# Patient Record
Sex: Male | Born: 1990 | Race: White | Hispanic: No | Marital: Married | Smoking: Never smoker
Health system: Southern US, Community
[De-identification: ages and names within clinical notes are randomized; demographics above are authoritative.]

## PROBLEM LIST (undated history)

## (undated) DIAGNOSIS — F411 Generalized anxiety disorder: Secondary | ICD-10-CM

## (undated) DIAGNOSIS — F322 Major depressive disorder, single episode, severe without psychotic features: Secondary | ICD-10-CM

## (undated) HISTORY — DX: Generalized anxiety disorder: F41.1

## (undated) HISTORY — DX: Major depressive disorder, single episode, severe without psychotic features: F32.2

---

## 2015-03-29 ENCOUNTER — Encounter: Payer: Self-pay | Admitting: Family Medicine

## 2015-03-29 ENCOUNTER — Ambulatory Visit (INDEPENDENT_AMBULATORY_CARE_PROVIDER_SITE_OTHER): Payer: BC Managed Care – PPO | Admitting: Family Medicine

## 2015-03-29 VITALS — BP 137/85 | HR 80 | Ht 72.0 in | Wt 257.0 lb

## 2015-03-29 DIAGNOSIS — F411 Generalized anxiety disorder: Secondary | ICD-10-CM

## 2015-03-29 DIAGNOSIS — IMO0001 Reserved for inherently not codable concepts without codable children: Secondary | ICD-10-CM

## 2015-03-29 DIAGNOSIS — R030 Elevated blood-pressure reading, without diagnosis of hypertension: Secondary | ICD-10-CM

## 2015-03-29 HISTORY — DX: Generalized anxiety disorder: F41.1

## 2015-03-29 NOTE — Progress Notes (Signed)
Noah Petersen is a 24 y.o. male who presents to [facility] [place]  today for anxiety. Patient notes situational anxiety. 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 Additionally he recently got married. He notes job and home stress. This seems to be worse recently. He denies any stresses interfering with his ability to function at home or work. He does not want to start medicine but is interested in counseling. He denies any chest pains palpitations or shortness of breath. No SI or HI.   History reviewed. No pertinent past medical history. History reviewed. No pertinent past surgical history. History  Substance Use Topics  . Smoking status: Never Smoker   . Smokeless tobacco: Not on file  . Alcohol Use: 0.0 oz/week    0 Standard drinks or equivalent per week   ROS as above Medications: No current outpatient prescriptions on file.   No current facility-administered medications for this visit.   No Known Allergies   Exam:  BP 137/85 mmHg  Pulse 80  Ht 6' (1.829 m)  Wt 257 lb (116.574 kg)  BMI 34.85 kg/m2 Gen: Well NAD HEENT: EOMI,  MMM Lungs: Normal work of breathing. CTABL Heart: RRR no MRG Abd: NABS, Soft. Nondistended, Nontender Exts: Brisk capillary refill, warm and well perfused.  Psych: Alert and oriented normal thought speech and affect. No SI or HI. GAD 7: Is 14  No results found for this or any previous visit (from the past 24 hour(s)). No results found.   Please see individual assessment and plan sections.

## 2015-03-29 NOTE — Assessment & Plan Note (Signed)
Refer to counseling. [facility]. Return in one month may start medicines at that time.

## 2015-03-29 NOTE — Patient Instructions (Signed)
Thank you for coming in today. Contact the [facility]  [address] [place], [place] [address] Phone: [phone] Fax: [phone] ?Email Address: [email or web address]   I will order a referral as needed.  Return in about 1 month.  Call or go to the emergency room if you get worse, have trouble breathing, have chest pains, or palpitations.   Generalized Anxiety Disorder Generalized anxiety disorder (GAD) is a mental disorder. It interferes with life functions, including relationships, work, and school. GAD is different from normal anxiety, which everyone experiences at some point in their lives in response to specific life events and activities. Normal anxiety actually helps [place] prepare for and get through these life events and activities. Normal anxiety goes away after the event or activity is over.  GAD causes anxiety that is not necessarily related to specific events or activities. It also causes excess anxiety in proportion to specific events or activities. The anxiety associated with GAD is also difficult to control. GAD can vary from mild to severe. People with severe GAD can have intense waves of anxiety with physical symptoms (panic attacks).  SYMPTOMS The anxiety and worry associated with GAD are difficult to control. This anxiety and worry are related to many life events and activities and also occur more days than not for 6 months or longer. People with GAD also have three or more of the following symptoms (one or more in children):  Restlessness.   Fatigue.  Difficulty concentrating.   Irritability.  Muscle tension.  Difficulty sleeping or unsatisfying sleep. DIAGNOSIS GAD is diagnosed through an assessment by your health care provider. Your health care provider will ask you questions aboutyour mood,physical symptoms, and events in your life. Your health care provider may ask you about your medical history and use of  alcohol or drugs, including prescription medicines. Your health care provider may also do a physical exam and blood tests. Certain medical conditions and the use of certain substances can cause symptoms similar to those associated with GAD. Your health care provider may refer you to a mental health specialist for further evaluation. TREATMENT The following therapies are usually used to treat GAD:   Medication. Antidepressant medication usually is prescribed for long-term daily control. Antianxiety medicines may be added in severe cases, especially when panic attacks occur.   Talk therapy (psychotherapy). Certain types of talk therapy can be helpful in treating GAD by providing support, education, and guidance. A form of talk therapy called cognitive behavioral therapy can teach you healthy ways to think about and react to daily life events and activities.  Stress managementtechniques. These include yoga, meditation, and exercise and can be very helpful when they are practiced regularly. A mental health specialist can help determine which treatment is best for you. Some people see improvement with one therapy. However, other people require a combination of therapies. Document Released: 12/15/2012 Document Revised: 01/04/2014 Document Reviewed: 12/15/2012 [facility] Patient Information 2015 [place], [place]. This information is not intended to replace advice given to you by your health care provider. Make sure you discuss any questions you have with your health care provider.

## 2015-03-29 NOTE — Assessment & Plan Note (Signed)
Not yet hypertension continue to monitor

## 2015-04-28 ENCOUNTER — Ambulatory Visit: Payer: BC Managed Care – PPO | Admitting: Family Medicine

## 2015-05-06 ENCOUNTER — Encounter: Payer: Self-pay | Admitting: Family Medicine

## 2015-05-06 ENCOUNTER — Ambulatory Visit (INDEPENDENT_AMBULATORY_CARE_PROVIDER_SITE_OTHER): Payer: BC Managed Care – PPO | Admitting: Family Medicine

## 2015-05-06 VITALS — BP 130/80 | HR 62 | Wt 259.0 lb

## 2015-05-06 DIAGNOSIS — M79671 Pain in right foot: Secondary | ICD-10-CM | POA: Diagnosis not present

## 2015-05-06 DIAGNOSIS — M79672 Pain in left foot: Secondary | ICD-10-CM | POA: Diagnosis not present

## 2015-05-06 NOTE — Progress Notes (Signed)
Noah Petersen is a 24 y.o. male who presents to [name]  today for orthotics and to discuss right-sided eye pain. A few days ago patient had about a minute of right eye pain with some blurry vision. This occurred spontaneously. He has not had any since. He feels well.  Additionally patient is here for orthotics. His existing rigid orthotics but notes that they are worn out and he would like new ones now.   No past medical history on file. No past surgical history on file. Social History  Substance Use Topics  . Smoking status: Never Smoker   . Smokeless tobacco: Not on file  . Alcohol Use: 0.0 oz/week    0 Standard drinks or equivalent per week   family history is not on file.  ROS as above Medications: No current outpatient prescriptions on file.   No current facility-administered medications for this visit.   No Known Allergies   Exam:  BP 130/80 mmHg  Pulse 62  Wt 259 lb (117.482 kg) Gen: Well NAD HEENT: EOMI,  MMM PERRLA   Visual acuity was 20/20 or better bilaterally     Patient was fitted for a : standard, cushioned, semi-rigid orthotic. The orthotic was heated and afterward the patient stood on the orthotic blank positioned on the orthotic stand. The patient was positioned in subtalar neutral position and 10 degrees of ankle dorsiflexion in a weight bearing stance. After completion of molding, a stable base was applied to the orthotic blank. The blank was ground to a stable position for weight bearing. Size: 12 Base: [place] [facility], hospital and Padding: None The patient ambulated these, and they were very comfortable.  I spent 40 minutes with this patient, greater than 50% was face-to-face time counseling regarding the below diagnosis.    No results found for this or any previous visit (from the past 24 hour(s)). No results found.   Eye pain unclear etiology. Patient is currently asymptomatic now with normal  eye exam and visual acuity. Return as needed

## 2015-05-06 NOTE — Assessment & Plan Note (Signed)
Patient was fitted for orthotics 05/06/2015

## 2015-05-06 NOTE — Patient Instructions (Signed)
Thank you for coming in today. Good luck.  Let me know if those orthotics bother you.  Return as needed.  Let me know if your eyes bother your.

## 2016-11-23 ENCOUNTER — Encounter: Payer: Self-pay | Admitting: Family Medicine

## 2016-11-23 ENCOUNTER — Ambulatory Visit (INDEPENDENT_AMBULATORY_CARE_PROVIDER_SITE_OTHER): Payer: BLUE CROSS/BLUE SHIELD | Admitting: Family Medicine

## 2016-11-23 ENCOUNTER — Ambulatory Visit: Payer: BLUE CROSS/BLUE SHIELD | Admitting: Sports Medicine

## 2016-11-23 VITALS — BP 128/72 | HR 78 | Wt 266.0 lb

## 2016-11-23 DIAGNOSIS — F411 Generalized anxiety disorder: Secondary | ICD-10-CM | POA: Diagnosis not present

## 2016-11-23 DIAGNOSIS — F325 Major depressive disorder, single episode, in full remission: Secondary | ICD-10-CM | POA: Insufficient documentation

## 2016-11-23 DIAGNOSIS — F322 Major depressive disorder, single episode, severe without psychotic features: Secondary | ICD-10-CM | POA: Diagnosis not present

## 2016-11-23 HISTORY — DX: Major depressive disorder, single episode, severe without psychotic features: F32.2

## 2016-11-23 MED ORDER — SERTRALINE HCL 25 MG PO TABS: ORAL_TABLET | ORAL | 0 refills | Status: DC

## 2016-11-23 NOTE — Progress Notes (Signed)
Noah Petersen is a 26 y.o. male who [facility] today for anxiety and depression. Patient notes a several month history of worsening anxiety and depression symptoms. 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 He quit his job because he hated it and now is working for [employer] and feels much better. He does note however his anxiety continues to be quite bothersome. He notes difficulty with sleep and excessive worrying. Additionally he notes depression symptoms but denies any SI or HI. He's never been on medications for this but he does note a family history of anxiety and depression.   Past Medical History:  Diagnosis Date  . 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 Generalized anxiety disorder 03/29/2015   No past surgical history on file. Social History  Substance Use Topics  . Smoking status: Never Smoker  . Smokeless tobacco: Never Used  . Alcohol use 0.0 oz/week   family history is not on file.  ROS as above:  Medications: Current Outpatient Prescriptions  Medication Sig Dispense Refill  . sertraline (ZOLOFT) 25 MG tablet Take 1 pill po daily then increase to 2 pills daily. 30 tablet 0   No current facility-administered medications for this visit.    No Known Allergies  Health Maintenance Health Maintenance  Topic Date Due  . HIV Screening  11/25/2004  . TETANUS/TDAP  11/25/2008  . INFLUENZA VACCINE  04/03/2016     Exam:  BP 128/72   Pulse 78   Wt 266 lb (120.7 kg)   BMI 36.08 kg/m  Gen: Well NAD Psych alert and oriented normal speech thought process and affect no SI or HI expressed.  [facility] 11/23/2016  Decreased Interest 3  Down, Depressed, Hopeless 3  PHQ - 2 Score 6  Altered sleeping 2  Tired, decreased energy 3  Change in appetite 3   Feeling bad or failure about yourself  3  Trouble concentrating 1  Moving slowly or fidgety/restless 2  Suicidal thoughts 0  PHQ-9 Score 20   GAD 7 : Generalized Anxiety Score 11/23/2016  Nervous, Anxious, on Edge 3  Control/stop worrying 1  Worry too much - different things 2  Trouble relaxing 1  Restless 2  Easily annoyed or irritable 2  Afraid - awful might happen 1  Total GAD 7 Score 12        No results found for this or any previous visit (from the past 72 hour(s)). No results found.    Assessment and Plan: 26 y.o. male with generalized anxiety disorder with major depression. Plan to refer for counseling and start Zoloft. We'll start 25 mg and increase to 50 mg one week and recheck in 2 weeks. Goal for 100 mg a day.   Orders Placed This Encounter  Procedures  . Ambulatory referral to Psychology    Referral Priority:   Routine    Referral Type:   Psychiatric    Referral Reason:   Specialty Services Required    Requested Specialty:   Psychology    Number of Visits Requested:   1   Meds ordered this encounter  Medications  . sertraline (ZOLOFT) 25 MG tablet    Sig: Take 1 pill po daily then increase to 2 pills daily.    Dispense:  30 tablet    Refill:  0     Discussed warning signs or  symptoms. Please see discharge instructions. Patient expresses understanding.

## 2016-11-23 NOTE — Patient Instructions (Signed)
Thank you for coming in today. START Zoloft 1 pill daily.  Increase to 2 pills daily.  Recheck in 2 weeks.   You should also be researching therapy options.   Sertraline tablets What is this medicine? SERTRALINE (SER tra leen) is used to treat depression. It may also be used to treat obsessive compulsive disorder, panic disorder, post-trauma stress, premenstrual dysphoric disorder (PMDD) or social anxiety. This medicine may be used for other purposes; ask your health care provider or pharmacist if you have questions. COMMON BRAND NAME(S): Zoloft What should I tell my health care provider before I take this medicine? They need to know if you have any of these conditions: -bleeding disorders -bipolar disorder or a family history of bipolar disorder -glaucoma -heart disease -high blood pressure -history of irregular heartbeat -history of low levels of calcium, magnesium, or potassium in the blood -if you often drink alcohol -liver disease -receiving electroconvulsive therapy -seizures -suicidal thoughts, plans, or attempt; a previous suicide attempt by you or a family member -take medicines that treat or prevent blood clots -thyroid disease -an unusual or allergic reaction to sertraline, other medicines, foods, dyes, or preservatives -pregnant or trying to get pregnant -breast-feeding How should I use this medicine? Take this medicine by mouth with a glass of water. Follow the directions on the prescription label. You can take it with or without food. Take your medicine at regular intervals. Do not take your medicine more often than directed. Do not stop taking this medicine suddenly except upon the advice of your doctor. Stopping this medicine too quickly may cause serious side effects or your condition may worsen. A special MedGuide will be given to you by the pharmacist with each prescription and refill. Be sure to read this information carefully each time. Talk to your pediatrician  regarding the use of this medicine in children. While this drug may be prescribed for children as young as 7 years for selected conditions, precautions do apply. Overdosage: If you think you have taken too much of this medicine contact a poison control center or emergency room at once. NOTE: This medicine is only for you. Do not share this medicine with others. What if I miss a dose? If you miss a dose, take it as soon as you can. If it is almost time for your next dose, take only that dose. Do not take double or extra doses. What may interact with this medicine? Do not take this medicine with any of the following medications: -cisapride -dofetilide -dronedarone -linezolid -MAOIs like Carbex, Eldepryl, Marplan, Nardil, and Parnate -methylene blue (injected into a vein) -pimozide -thioridazine This medicine may also interact with the following medications: -alcohol -amphetamines -aspirin and aspirin-like medicines -certain medicines for depression, anxiety, or psychotic disturbances -certain medicines for fungal infections like ketoconazole, fluconazole, posaconazole, and itraconazole -certain medicines for irregular heart beat like flecainide, quinidine, propafenone -certain medicines for migraine headaches like almotriptan, eletriptan, frovatriptan, naratriptan, rizatriptan, sumatriptan, zolmitriptan -certain medicines for sleep -certain medicines for seizures like carbamazepine, valproic acid, phenytoin -certain medicines that treat or prevent blood clots like warfarin, enoxaparin, dalteparin -cimetidine -digoxin -diuretics -fentanyl -isoniazid -lithium -NSAIDs, medicines for pain and inflammation, like ibuprofen or naproxen -other medicines that prolong the QT interval (cause an abnormal heart rhythm) -rasagiline -safinamide -supplements like St. John's wort, kava kava, valerian -tolbutamide -tramadol -tryptophan This list may not describe all possible interactions. Give  your health care provider a list of all the medicines, herbs, non-prescription drugs, or dietary supplements you use. Also  tell them if you smoke, drink alcohol, or use illegal drugs. Some items may interact with your medicine. What should I watch for while using this medicine? Tell your doctor if your symptoms do not get better or if they get worse. Visit your doctor or health care professional for regular checks on your progress. Because it may take several weeks to see the full effects of this medicine, it is important to continue your treatment as prescribed by your doctor. Patients and their families should watch out for new or worsening thoughts of suicide or depression. Also watch out for sudden changes in feelings such as feeling anxious, agitated, panicky, irritable, hostile, aggressive, impulsive, severely restless, overly excited and hyperactive, or not being able to sleep. If this happens, especially at the beginning of treatment or after a change in dose, call your health care professional. Bonita Quin may get drowsy or dizzy. Do not drive, use machinery, or do anything that needs mental alertness until you know how this medicine affects you. Do not stand or sit up quickly, especially if you are an older patient. This reduces the risk of dizzy or fainting spells. Alcohol may interfere with the effect of this medicine. Avoid alcoholic drinks. Your mouth may get dry. Chewing sugarless gum or sucking hard candy, and drinking plenty of water may help. Contact your doctor if the problem does not go away or is severe. What side effects may I notice from receiving this medicine? Side effects that you should report to your doctor or health care professional as soon as possible: -allergic reactions like skin rash, itching or hives, swelling of the face, lips, or tongue -anxious -black, tarry stools -changes in vision -confusion -elevated mood, decreased need for sleep, racing thoughts, impulsive  behavior -eye pain -fast, irregular heartbeat -feeling faint or lightheaded, falls -feeling agitated, angry, or irritable -hallucination, loss of contact with reality -loss of balance or coordination -loss of memory -painful or prolonged erections -restlessness, pacing, inability to keep still -seizures -stiff muscles -suicidal thoughts or other mood changes -trouble sleeping -unusual bleeding or bruising -unusually weak or tired -vomiting Side effects that usually do not require medical attention (report to your doctor or health care professional if they continue or are bothersome): -change in appetite or weight -change in sex drive or performance -diarrhea -increased sweating -indigestion, nausea -tremors This list may not describe all possible side effects. Call your doctor for medical advice about side effects. You may report side effects to FDA at 1-800-FDA-1088. Where should I keep my medicine? Keep out of the reach of children. Store at room temperature between 15 and 30 degrees C (59 and 86 degrees F). Throw away any unused medicine after the expiration date. NOTE: This sheet is a summary. It may not cover all possible information. If you have questions about this medicine, talk to your doctor, pharmacist, or health care provider.  2018 Elsevier/Gold Standard (2016-08-24 14:17:49)

## 2016-12-07 ENCOUNTER — Encounter: Payer: Self-pay | Admitting: Family Medicine

## 2016-12-07 ENCOUNTER — Ambulatory Visit (INDEPENDENT_AMBULATORY_CARE_PROVIDER_SITE_OTHER): Payer: BLUE CROSS/BLUE SHIELD | Admitting: Family Medicine

## 2016-12-07 VITALS — BP 145/70 | HR 66 | Wt 261.0 lb

## 2016-12-07 DIAGNOSIS — F411 Generalized anxiety disorder: Secondary | ICD-10-CM

## 2016-12-07 DIAGNOSIS — F322 Major depressive disorder, single episode, severe without psychotic features: Secondary | ICD-10-CM

## 2016-12-07 DIAGNOSIS — F39 Unspecified mood [affective] disorder: Secondary | ICD-10-CM | POA: Diagnosis not present

## 2016-12-07 MED ORDER — BUPROPION HCL ER (XL) 150 MG PO TB24: 150.0000 mg | ORAL_TABLET | ORAL | 0 refills | Status: DC

## 2016-12-07 MED ORDER — SERTRALINE HCL 100 MG PO TABS: 100.0000 mg | ORAL_TABLET | Freq: Every day | ORAL | 1 refills | Status: DC

## 2016-12-07 NOTE — Progress Notes (Signed)
       Noah Petersen is a 26 y.o. male who presents to [facility] today for follow up anxiety and depression symptoms.   Noah Petersen was seen March 23 where he was diagnosed with recurrent generalized anxiety disorder and depression symptoms. He was started on Zoloft. He has titrated up to 50 mg. He has an initial appointment with a therapist today as well.  He notes the Zoloft has improved his symptoms some. He does note however delayed orgasm with this medication which is obnoxious.   Past Medical History:  Diagnosis Date  . 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 Generalized anxiety disorder 03/29/2015   No past surgical history on file. Social History  Substance Use Topics  . Smoking status: Never Smoker  . Smokeless tobacco: Never Used  . Alcohol use 0.0 oz/week   family history is not on file.  ROS as above:  Medications: Current Outpatient Prescriptions  Medication Sig Dispense Refill  . buPROPion (WELLBUTRIN XL) 150 MG 24 hr tablet Take 1 tablet (150 mg total) by mouth every morning. 90 tablet 0  . sertraline (ZOLOFT) 100 MG tablet Take 1 tablet (100 mg total) by mouth daily. 30 tablet 1   No current facility-administered medications for this visit.    No Known Allergies  Health Maintenance Health Maintenance  Topic Date Due  . HIV Screening  11/25/2004  . TETANUS/TDAP  11/25/2008  . INFLUENZA VACCINE  05/04/2017 (Originally 04/03/2017)     Exam:  BP (!) 145/70   Pulse 66   Wt 261 lb (118.4 kg)   BMI 35.40 kg/m  Gen: Well NAD Psych: Alert and oriented normal speech thought process and affect. Depression screen [facility] 2/9 12/07/2016 11/23/2016  Decreased Interest 1 3  Down, Depressed, Hopeless 1 3  PHQ - 2 Score 2 6  Altered sleeping - 2  Tired, decreased energy - 3  Change in appetite - 3  Feeling bad or failure about yourself  -  3  Trouble concentrating - 1  Moving slowly or fidgety/restless - 2  Suicidal thoughts - 0  PHQ-9 Score - 20      No results found for this or any previous visit (from the past 72 hour(s)). No results found.    Assessment and Plan: 26 y.o. male with  Improving anxiety and depression symptoms. Plan to increase Zoloft 200 mg next week. Add Wellbutrin for sexual side effects prevention. Recheck in about a month or so. Continue counseling/therapy.   No orders of the defined types were placed in this encounter.  Meds ordered this encounter  Medications  . sertraline (ZOLOFT) 100 MG tablet    Sig: Take 1 tablet (100 mg total) by mouth daily.    Dispense:  30 tablet    Refill:  1  . buPROPion (WELLBUTRIN XL) 150 MG 24 hr tablet    Sig: Take 1 tablet (150 mg total) by mouth every morning.    Dispense:  90 tablet    Refill:  0     Discussed warning signs or symptoms. Please see discharge instructions. Patient expresses understanding.

## 2016-12-07 NOTE — Patient Instructions (Addendum)
Thank you for coming in today. Continue zoloft  daily for 1 more week then increase to  daily.  I am sending in  pills so you can take 1/2 pill for 1 week more.   This may change based on psychiatry recommendations.   You should follow up with me in 1 month.    Start welbutrin daily for help with sexual side effects.    Bupropion extended-release tablets (Depression/Mood Disorders) What is this medicine? BUPROPION (byoo PROE pee on) is used to treat depression. This medicine may be used for other purposes; ask your health care provider or pharmacist if you have questions. COMMON BRAND NAME(S): Aplenzin, Budeprion XL, Forfivo XL, Wellbutrin XL What should I tell my health care provider before I take this medicine? They need to know if you have any of these conditions: -an eating disorder, such as anorexia or bulimia -bipolar disorder or psychosis -diabetes or high blood sugar, treated with medication -glaucoma -head injury or brain tumor -heart disease, previous heart attack, or irregular heart beat -high blood pressure -kidney or liver disease -seizures (convulsions) -suicidal thoughts or a previous suicide attempt -Tourette's syndrome -weight loss -an unusual or allergic reaction to bupropion, other medicines, foods, dyes, or preservatives -breast-feeding -pregnant or trying to become pregnant How should I use this medicine? Take this medicine by mouth with a glass of water. Follow the directions on the prescription label. You can take it with or without food. If it upsets your stomach, take it with food. Do not crush, chew, or cut these tablets. This medicine is taken once daily at the same time each day. Do not take your medicine more often than directed. Do not stop taking this medicine suddenly except upon the advice of your doctor. Stopping this medicine too quickly may cause serious side effects or your condition may worsen. A special MedGuide will be given to  you by the pharmacist with each prescription and refill. Be sure to read this information carefully each time. Talk to your pediatrician regarding the use of this medicine in children. Special care may be needed. Overdosage: If you think you have taken too much of this medicine contact a poison control center or emergency room at once. NOTE: This medicine is only for you. Do not share this medicine with others. What if I miss a dose? If you miss a dose, skip the missed dose and take your next tablet at the regular time. Do not take double or extra doses. What may interact with this medicine? Do not take this medicine with any of the following medications: -linezolid -MAOIs like Azilect, Carbex, Eldepryl, Marplan, Nardil, and Parnate -methylene blue (injected into a vein) -other medicines that contain bupropion like Zyban This medicine may also interact with the following medications: -alcohol -certain medicines for anxiety or sleep -certain medicines for blood pressure like metoprolol, propranolol -certain medicines for depression or psychotic disturbances -certain medicines for HIV or AIDS like efavirenz, lopinavir, nelfinavir, ritonavir -certain medicines for irregular heart beat like propafenone, flecainide -certain medicines for Parkinson's disease like amantadine, levodopa -certain medicines for seizures like carbamazepine, phenytoin, phenobarbital -cimetidine -clopidogrel -cyclophosphamide -digoxin -furazolidone -isoniazid -nicotine -orphenadrine -procarbazine -steroid medicines like prednisone or cortisone -stimulant medicines for attention disorders, weight loss, or to stay awake -tamoxifen -theophylline -thiotepa -ticlopidine -tramadol -warfarin This list may not describe all possible interactions. Give your health care provider a list of all the medicines, herbs, non-prescription drugs, or dietary supplements you use. Also tell them if you smoke, drink alcohol,  or use  illegal drugs. Some items may interact with your medicine. What should I watch for while using this medicine? Tell your doctor if your symptoms do not get better or if they get worse. Visit your doctor or health care professional for regular checks on your progress. Because it may take several weeks to see the full effects of this medicine, it is important to continue your treatment as prescribed by your doctor. Patients and their families should watch out for new or worsening thoughts of suicide or depression. Also watch out for sudden changes in feelings such as feeling anxious, agitated, panicky, irritable, hostile, aggressive, impulsive, severely restless, overly excited and hyperactive, or not being able to sleep. If this happens, especially at the beginning of treatment or after a change in dose, call your health care professional. Avoid alcoholic drinks while taking this medicine. Drinking large amounts of alcoholic beverages, using sleeping or anxiety medicines, or quickly stopping the use of these agents while taking this medicine may increase your risk for a seizure. Do not drive or use heavy machinery until you know how this medicine affects you. This medicine can impair your ability to perform these tasks. Do not take this medicine close to bedtime. It may prevent you from sleeping. Your mouth may get dry. Chewing sugarless gum or sucking hard candy, and drinking plenty of water may help. Contact your doctor if the problem does not go away or is severe. The tablet shell for some brands of this medicine does not dissolve. This is normal. The tablet shell may appear whole in the stool. This is not a cause for concern. What side effects may I notice from receiving this medicine? Side effects that you should report to your doctor or health care professional as soon as possible: -allergic reactions like skin rash, itching or hives, swelling of the face, lips, or tongue -breathing  problems -changes in vision -confusion -elevated mood, decreased need for sleep, racing thoughts, impulsive behavior -fast or irregular heartbeat -hallucinations, loss of contact with reality -increased blood pressure -redness, blistering, peeling or loosening of the skin, including inside the mouth -seizures -suicidal thoughts or other mood changes -unusually weak or tired -vomiting Side effects that usually do not require medical attention (report to your doctor or health care professional if they continue or are bothersome): -constipation -headache -loss of appetite -nausea -tremors -weight loss This list may not describe all possible side effects. Call your doctor for medical advice about side effects. You may report side effects to FDA at 1-800-FDA-1088. Where should I keep my medicine? Keep out of the reach of children. Store at room temperature between 15 and 30 degrees C (59 and 86 degrees F). Throw away any unused medicine after the expiration date. NOTE: This sheet is a summary. It may not cover all possible information. If you have questions about this medicine, talk to your doctor, pharmacist, or health care provider.  2018 Elsevier/Gold Standard (2016-02-10 13:55:13)

## 2016-12-28 DIAGNOSIS — F39 Unspecified mood [affective] disorder: Secondary | ICD-10-CM | POA: Diagnosis not present

## 2017-01-10 ENCOUNTER — Encounter: Payer: Self-pay | Admitting: Family Medicine

## 2017-01-11 MED ORDER — VILAZODONE HCL 10 & 20 MG PO KIT: 1.0000 | PACK | Freq: Every day | ORAL | 0 refills | Status: DC

## 2017-02-12 ENCOUNTER — Encounter: Payer: Self-pay | Admitting: Family Medicine

## 2017-02-12 ENCOUNTER — Other Ambulatory Visit: Payer: Self-pay | Admitting: Family Medicine

## 2017-02-12 MED ORDER — VILAZODONE HCL 40 MG PO TABS: 40.0000 mg | ORAL_TABLET | Freq: Every day | ORAL | 0 refills | Status: DC

## 2017-02-19 ENCOUNTER — Ambulatory Visit (INDEPENDENT_AMBULATORY_CARE_PROVIDER_SITE_OTHER): Payer: BLUE CROSS/BLUE SHIELD | Admitting: Family Medicine

## 2017-02-19 ENCOUNTER — Encounter: Payer: Self-pay | Admitting: Family Medicine

## 2017-02-19 VITALS — BP 126/70 | HR 72 | Wt 255.0 lb

## 2017-02-19 DIAGNOSIS — E669 Obesity, unspecified: Secondary | ICD-10-CM | POA: Diagnosis not present

## 2017-02-19 DIAGNOSIS — F411 Generalized anxiety disorder: Secondary | ICD-10-CM | POA: Diagnosis not present

## 2017-02-19 DIAGNOSIS — F325 Major depressive disorder, single episode, in full remission: Secondary | ICD-10-CM

## 2017-02-19 NOTE — Progress Notes (Signed)
Noah Petersen is a 26 y.o. male who presents to [name]: Primary Care Sports Medicine today for follow up anxiety and discuss weight loss.   Telford has been taking Viibryd now for about 1 month. He notes that it works quite well and he denies any significant side effects. He's feeling much better. He denies any SI or HI.  Additionally he is working to lose weight. 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 He is eating a calorie strict a diet around 2100 cal per day and exercising moderately.   Past Medical History:  Diagnosis Date  . 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 Generalized anxiety disorder 03/29/2015   No past surgical history on file. Social History  Substance Use Topics  . Smoking status: Never Smoker  . Smokeless tobacco: Never Used  . Alcohol use 0.0 oz/week   family history is not on file.  ROS as above:  Medications: Current Outpatient Prescriptions  Medication Sig Dispense Refill  . buPROPion (WELLBUTRIN XL) 150 MG 24 hr tablet Take 1 tablet (150 mg total) by mouth every morning. 90 tablet 0  . sertraline (ZOLOFT) 100 MG tablet Take 1 tablet (100 mg total) by mouth daily. 30 tablet 1  . Vilazodone HCl (VIIBRYD) 40 MG TABS Take 1 tablet (40 mg total) by mouth daily. 90 tablet 0   No current facility-administered medications for this visit.    No Known Allergies  Health Maintenance Health Maintenance  Topic Date Due  . HIV Screening  11/25/2004  . TETANUS/TDAP  11/25/2008  . INFLUENZA VACCINE  05/04/2017 (Originally 04/03/2017)     Exam:  BP 126/70   Pulse 72   Wt 255 lb (115.7 kg)   BMI 34.58 kg/m   Wt Readings from Last 5 Encounters:  02/19/17 255 lb (115.7 kg)  12/07/16 261 lb (118.4 kg)  11/23/16 266 lb (120.7 kg)  05/06/15 259 lb (117.5 kg)  03/29/15 257 lb (116.6 kg)    Gen: Well  NAD HEENT: EOMI,  MMM Lungs: Normal work of breathing. CTABL Heart: RRR no MRG Abd: NABS, Soft. Nondistended, Nontender Exts: Brisk capillary refill, warm and well perfused.  Psych: Alert and oriented normal speech thought process and affect. No SI or HI expressed. Depression screen [facility] 2/9 02/19/2017 12/07/2016 11/23/2016  Decreased Interest 0 1 3  Down, Depressed, Hopeless 0 1 3  PHQ - 2 Score 0 2 6  Altered sleeping 1 - 2  Tired, decreased energy 0 - 3  Change in appetite 1 - 3  Feeling bad or failure about yourself  0 - 3  Trouble concentrating 0 - 1  Moving slowly or fidgety/restless 0 - 2  Suicidal thoughts 0 - 0  PHQ-9 Score 2 - 20   GAD 7 : Generalized Anxiety Score 02/19/2017 11/23/2016  Nervous, Anxious, on Edge 1 3  Control/stop worrying 1 1  Worry too much - different things 0 2  Trouble relaxing 0 1  Restless 0 2  Easily annoyed or irritable 0 2  Afraid - awful might happen 0 1  Total GAD 7 Score 2 12      No results found for this or any previous visit (from the past 72 hour(s)). No results found.    Assessment and Plan: 26 y.o. male with  Anxiety: Much improved. Plan to continue Viibryd. Encourage exercise for mental health. Recheck yearly as needed.  Obesity: Agree with weight loss.  Calorie restricted diet seems reasonable. Encourage moderate exercise. Recheck as needed.   No orders of the defined types were placed in this encounter.  No orders of the defined types were placed in this encounter.    Discussed warning signs or symptoms. Please see discharge instructions. Patient expresses understanding.

## 2017-02-19 NOTE — Patient Instructions (Signed)
Thank you for coming in today. Continue viibryd.  Recheck in 1 year or sooner if needed.   We can make a second pair or orthotics for your fireboots.  Bring the boots with you.   Work on a calorie restricted diet.  Around 2000 calories per day will get you to around 200 pounds in about 1 year.   Try to get the records of your physical sent to me.

## 2017-03-08 ENCOUNTER — Encounter: Payer: Self-pay | Admitting: Family Medicine

## 2017-03-08 MED ORDER — PREDNISONE 5 MG (48) PO TBPK: ORAL_TABLET | ORAL | 0 refills | Status: DC

## 2017-03-08 MED ORDER — TRIAMCINOLONE ACETONIDE 0.1 % EX CREA: 1.0000 "application " | TOPICAL_CREAM | Freq: Two times a day (BID) | CUTANEOUS | 0 refills | Status: DC

## 2017-04-01 ENCOUNTER — Encounter: Payer: Self-pay | Admitting: Family Medicine

## 2017-04-01 DIAGNOSIS — E669 Obesity, unspecified: Secondary | ICD-10-CM

## 2017-04-26 ENCOUNTER — Ambulatory Visit (INDEPENDENT_AMBULATORY_CARE_PROVIDER_SITE_OTHER): Payer: BLUE CROSS/BLUE SHIELD | Admitting: Family Medicine

## 2017-04-26 ENCOUNTER — Encounter: Payer: Self-pay | Admitting: Family Medicine

## 2017-04-26 VITALS — BP 127/78 | HR 71 | Wt 252.0 lb

## 2017-04-26 DIAGNOSIS — F411 Generalized anxiety disorder: Secondary | ICD-10-CM | POA: Diagnosis not present

## 2017-04-26 DIAGNOSIS — R42 Dizziness and giddiness: Secondary | ICD-10-CM | POA: Insufficient documentation

## 2017-04-26 MED ORDER — VENLAFAXINE HCL ER 75 MG PO CP24: 75.0000 mg | ORAL_CAPSULE | Freq: Every day | ORAL | 1 refills | Status: DC

## 2017-04-26 NOTE — Progress Notes (Signed)
Noah Petersen is a 26 y.o. male who presents to [name]: Primary Care Sports Medicine today for vertigo and anxiety.  Patient notes vertigo over the last few weeks. He's had vertigo in the past but has done well for a long time. He notes a sensation of spinning when he lays down at times. He denies any syncope and feels well otherwise. No injury or change in hearing.  Anxiety: Patient is a long history of anxiety. He has been previously well controlled with Zoloft but had difficulty with delayed orgasm. He was switched to fibroid which worked until recently. He has worsening anxiety symptoms listed below.   Past Medical History:  Diagnosis Date  . 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 Generalized anxiety disorder 03/29/2015   No past surgical history on file. Social History  Substance Use Topics  . Smoking status: Never Smoker  . Smokeless tobacco: Never Used  . Alcohol use 0.0 oz/week   family history is not on file.  ROS as above:  Medications: Current Outpatient Prescriptions  Medication Sig Dispense Refill  . venlafaxine XR (EFFEXOR XR) 75 MG 24 hr capsule Take 1 capsule (75 mg total) by mouth daily with breakfast. 30 capsule 1   No current facility-administered medications for this visit.    No Known Allergies  Health Maintenance Health Maintenance  Topic Date Due  . HIV Screening  11/25/2004  . INFLUENZA VACCINE  04/26/2018 (Originally 04/03/2017)  . TETANUS/TDAP  04/26/2018 (Originally 11/25/2008)     Exam:  BP 127/78   Pulse 71   Wt 252 lb (114.3 kg)   BMI 34.18 kg/m   Wt Readings from Last 10 Encounters:  04/26/17 252 lb (114.3 kg)  02/19/17 255 lb (115.7 kg)  12/07/16 261 lb (118.4 kg)  11/23/16 266 lb (120.7 kg)  05/06/15 259 lb (117.5 kg)  03/29/15 257 lb (116.6 kg)    Gen: Well NAD HEENT: EOMI,  MMM Normal tympanic membranes  bilaterally Lungs: Normal work of breathing. CTABL Heart: RRR no MRG Abd: NABS, Soft. Nondistended, Nontender Exts: Brisk capillary refill, warm and well perfused.  Neuro: Normal coordination balance and gait. Negative Dix-Hallpike test.  Depression screen [facility] 2/9 04/26/2017 02/19/2017 12/07/2016 11/23/2016  Decreased Interest 1 0 1 3  Down, Depressed, Hopeless 1 0 1 3  PHQ - 2 Score 2 0 2 6  Altered sleeping 2 1 - 2  Tired, decreased energy 1 0 - 3  Change in appetite 2 1 - 3  Feeling bad or failure about yourself  0 0 - 3  Trouble concentrating 1 0 - 1  Moving slowly or fidgety/restless 0 0 - 2  Suicidal thoughts 0 0 - 0  PHQ-9 Score 8 2 - 20   GAD 7 : Generalized Anxiety Score 04/26/2017 02/19/2017 11/23/2016  Nervous, Anxious, on Edge 2 1 3   Control/stop worrying 1 1 1   Worry too much - different things 1 0 2  Trouble relaxing 1 0 1  Restless 2 0 2  Easily annoyed or irritable 2 0 2  Afraid - awful might happen 1 0 1  Total GAD 7 Score 10 2 12       No results found for this or any previous visit (from the past 72 hour(s)). No results found.    Assessment and Plan: 26 y.o. male with  Vertigo: Unclear etiology suspect vestibular type. Plan to refer to balance physical therapy for further evaluation and management.  Anxiety not well-controlled with fibroid. Will switch to Effexor and recheck in a month.   Orders Placed This Encounter  Procedures  . Ambulatory referral to Physical Therapy    Referral Priority:   Routine    Referral Type:   Physical Medicine    Referral Reason:   Specialty Services Required    Requested Specialty:   Physical Therapy   Meds ordered this encounter  Medications  . DISCONTD: venlafaxine XR (EFFEXOR XR) 75 MG 24 hr capsule    Sig: Take 1 capsule (75 mg total) by mouth daily with breakfast.    Dispense:  30 capsule    Refill:  1  . venlafaxine XR (EFFEXOR XR) 75 MG 24 hr capsule    Sig: Take 1 capsule (75 mg total) by mouth daily with  breakfast.    Dispense:  30 capsule    Refill:  1     Discussed warning signs or symptoms. Please see discharge instructions. Patient expresses understanding.  I spent 25 minutes with this patient, greater than 50% was face-to-face time counseling regarding cause of vertigo differential diagnosis and treatment plan.Marland Kitchen

## 2017-04-26 NOTE — Patient Instructions (Signed)
Thank you for coming in today. STOP Viibryd.  Take 1/2 pill for 2 days then on the 3rd day (Sunday) start Effexor.  You should also hear from [facility] about dizziness.   Lets recheck in 1 month.   For weight keep track of diet.  Vertigo Vertigo means that you feel like you are moving when you are not. Vertigo can also make you feel like things around you are moving when they are not. This feeling can come and go at any time. Vertigo often goes away on its own. Follow these instructions at home:  Avoid making fast movements.  Avoid driving.  Avoid using heavy machinery.  Avoid doing any task or activity that might cause danger to you or other people if you would have a vertigo attack while you are doing it.  Sit down right away if you feel dizzy or have trouble with your balance.  Take over-the-counter and prescription medicines only as told by your doctor.  Follow instructions from your doctor about which positions or movements you should avoid.  Drink enough fluid to keep your pee (urine) clear or pale yellow.  Keep all follow-up visits as told by your doctor. This is important. Contact a doctor if:  Medicine does not help your vertigo.  You have a fever.  Your problems get worse or you have new symptoms.  Your family or friends see changes in your behavior.  You feel sick to your stomach (nauseous) or you throw up (vomit).  You have a "pins and needles" feeling or you are numb in part of your body. Get help right away if:  You have trouble moving or talking.  You are always dizzy.  You pass out (faint).  You get very bad headaches.  You feel weak or have trouble using your hands, arms, or legs.  You have changes in your hearing.  You have changes in your seeing (vision).  You get a stiff neck.  Bright light starts to bother you. This information is not intended to replace advice given to you by your health care provider. Make sure you discuss any  questions you have with your health care provider. Document Released: 05/29/2008 Document Revised: 01/26/2016 Document Reviewed: 12/13/2014 Elsevier Interactive Patient Education  2018 [employer].  Venlafaxine extended-release capsules What is this medicine? VENLAFAXINE(VEN la fax een) is used to treat depression, anxiety and panic disorder. This medicine may be used for other purposes; ask your health care provider or pharmacist if you have questions. COMMON BRAND NAME(S): Effexor XR What should I tell my health care provider before I take this medicine? They need to know if you have any of these conditions: -bleeding disorders -glaucoma -heart disease -high blood pressure -high cholesterol -kidney disease -liver disease -low levels of sodium in the blood -mania or bipolar disorder -seizures -suicidal thoughts, plans, or attempt; a previous suicide attempt by you or a family -take medicines that treat or prevent blood clots -thyroid disease -an unusual or allergic reaction to venlafaxine, desvenlafaxine, other medicines, foods, dyes, or preservatives -pregnant or trying to get pregnant -breast-feeding How should I use this medicine? Take this medicine by mouth with a full glass of water. Follow the directions on the prescription label. Do not cut, crush, or chew this medicine. Take it with food. If needed, the capsule may be carefully opened and the entire contents sprinkled on a spoonful of cool applesauce. Swallow the applesauce/pellet mixture right away without chewing and follow with a glass of water  to ensure complete swallowing of the pellets. Try to take your medicine at about the same time each day. Do not take your medicine more often than directed. Do not stop taking this medicine suddenly except upon the advice of your doctor. Stopping this medicine too quickly may cause serious side effects or your condition may worsen. A special MedGuide will be given to you by the  pharmacist with each prescription and refill. Be sure to read this information carefully each time. Talk to your pediatrician regarding the use of this medicine in children. Special care may be needed. Overdosage: If you think you have taken too much of this medicine contact a poison control center or emergency room at once. NOTE: This medicine is only for you. Do not share this medicine with others. What if I miss a dose? If you miss a dose, take it as soon as you can. If it is almost time for your next dose, take only that dose. Do not take double or extra doses. What may interact with this medicine? Do not take this medicine with any of the following medications: -certain medicines for fungal infections like fluconazole, itraconazole, ketoconazole, posaconazole, voriconazole -cisapride -desvenlafaxine -dofetilide -dronedarone -duloxetine -levomilnacipran -linezolid -MAOIs like Carbex, Eldepryl, Marplan, Nardil, and Parnate -methylene blue (injected into a vein) -milnacipran -pimozide -thioridazine -ziprasidone This medicine may also interact with the following medications: -amphetamines -aspirin and aspirin-like medicines -certain medicines for depression, anxiety, or psychotic disturbances -certain medicines for migraine headaches like almotriptan, eletriptan, frovatriptan, naratriptan, rizatriptan, sumatriptan, zolmitriptan -certain medicines for sleep -certain medicines that treat or prevent blood clots like dalteparin, enoxaparin, warfarin -cimetidine -clozapine -diuretics -fentanyl -furazolidone -indinavir -isoniazid -lithium -metoprolol -NSAIDS, medicines for pain and inflammation, like ibuprofen or naproxen -other medicines that prolong the QT interval (cause an abnormal heart rhythm) -procarbazine -rasagiline -supplements like St. John's wort, kava kava, valerian -tramadol -tryptophan This list may not describe all possible interactions. Give your health care  provider a list of all the medicines, herbs, non-prescription drugs, or dietary supplements you use. Also tell them if you smoke, drink alcohol, or use illegal drugs. Some items may interact with your medicine. What should I watch for while using this medicine? Tell your doctor if your symptoms do not get better or if they get worse. Visit your doctor or health care professional for regular checks on your progress. Because it may take several weeks to see the full effects of this medicine, it is important to continue your treatment as prescribed by your doctor. Patients and their families should watch out for new or worsening thoughts of suicide or depression. Also watch out for sudden changes in feelings such as feeling anxious, agitated, panicky, irritable, hostile, aggressive, impulsive, severely restless, overly excited and hyperactive, or not being able to sleep. If this happens, especially at the beginning of treatment or after a change in dose, call your health care professional. This medicine can cause an increase in blood pressure. Check with your doctor for instructions on monitoring your blood pressure while taking this medicine. You may get drowsy or dizzy. Do not drive, use machinery, or do anything that needs mental alertness until you know how this medicine affects you. Do not stand or sit up quickly, especially if you are an older patient. This reduces the risk of dizzy or fainting spells. Alcohol may interfere with the effect of this medicine. Avoid alcoholic drinks. Your mouth may get dry. Chewing sugarless gum, sucking hard candy and drinking plenty of water will help.  Contact your doctor if the problem does not go away or is severe. What side effects may I notice from receiving this medicine? Side effects that you should report to your doctor or health care professional as soon as possible: -allergic reactions like skin rash, itching or hives, swelling of the face, lips, or  tongue -anxious -breathing problems -confusion -changes in vision -chest pain -confusion -elevated mood, decreased need for sleep, racing thoughts, impulsive behavior -eye pain -fast, irregular heartbeat -feeling faint or lightheaded, falls -feeling agitated, angry, or irritable -hallucination, loss of contact with reality -high blood pressure -loss of balance or coordination -palpitations -redness, blistering, peeling or loosening of the skin, including inside the mouth -restlessness, pacing, inability to keep still -seizures -stiff muscles -suicidal thoughts or other mood changes -trouble passing urine or change in the amount of urine -trouble sleeping -unusual bleeding or bruising -unusually weak or tired -vomiting Side effects that usually do not require medical attention (report to your doctor or health care professional if they continue or are bothersome): -change in sex drive or performance -change in appetite or weight -constipation -dizziness -dry mouth -headache -increased sweating -nausea -tired This list may not describe all possible side effects. Call your doctor for medical advice about side effects. You may report side effects to FDA at 1-800-FDA-1088. Where should I keep my medicine? Keep out of the reach of children. Store at a controlled temperature between 20 and 25 degrees C (68 degrees and 77 degrees F), in a dry place. Throw away any unused medicine after the expiration date. NOTE: This sheet is a summary. It may not cover all possible information. If you have questions about this medicine, talk to your doctor, pharmacist, or health care provider.  2018 Elsevier/Gold Standard (2016-01-19 18:38:02)

## 2017-05-02 ENCOUNTER — Encounter: Payer: Self-pay | Admitting: Family Medicine

## 2017-05-02 ENCOUNTER — Ambulatory Visit (INDEPENDENT_AMBULATORY_CARE_PROVIDER_SITE_OTHER): Payer: BLUE CROSS/BLUE SHIELD | Admitting: Family Medicine

## 2017-05-02 VITALS — BP 124/66 | HR 73 | Temp 98.3°F | Wt 250.0 lb

## 2017-05-02 DIAGNOSIS — R197 Diarrhea, unspecified: Secondary | ICD-10-CM

## 2017-05-02 NOTE — Progress Notes (Signed)
       Noah Petersen is a 26 y.o. male who [facility] today for diarrhea.   Patient reports diarrhea for 3 days. Patient denies any recent changes in diet. He does not have any cramping, bloating, or abdominal pain. He denies any nausea or vomiting. Patient tried using Pepto-Bismol, but does not feel this helped. Patient denies any fatigue, recent antibiotic use, fevers, or chills. Patient began taking Effexor 5 days ago. Patient does not have any sick contacts.   Patient denies any chest pain, shortness of breath, or changes in urination.   Past Medical History:  Diagnosis Date  . 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 Generalized anxiety disorder 03/29/2015   No past surgical history on file. Social History  Substance Use Topics  . Smoking status: Never Smoker  . Smokeless tobacco: Never Used  . Alcohol use 0.0 oz/week   family history is not on file.  ROS as above:  Medications: Current Outpatient Prescriptions  Medication Sig Dispense Refill  . venlafaxine XR (EFFEXOR XR) 75 MG 24 hr capsule Take 1 capsule (75 mg total) by mouth daily with breakfast. 30 capsule 1   No current facility-administered medications for this visit.    No Known Allergies  Health Maintenance Health Maintenance  Topic Date Due  . HIV Screening  11/25/2004  . INFLUENZA VACCINE  04/26/2018 (Originally 04/03/2017)  . TETANUS/TDAP  04/26/2018 (Originally 11/25/2008)     Exam:  BP 124/66   Pulse 73   Temp 98.3 F (36.8 C) (Oral)   Wt 250 lb (113.4 kg)   BMI 33.91 kg/m  Gen: Well NAD, sitting comfortably on examination table HEENT: EOMI,  MMM Lungs: Normal work of breathing. CTABL Heart: RRR, normal S1 and S2, no MRG Abd: NABS, Soft. Nondistended, Nontender Exts: Brisk capillary refill, warm and well perfused.    No results found for this or  any previous visit (from the past 72 hour(s)). No results found.    Assessment and Plan: 26 y.o. male with diarrhea. The differential diagnosis includes viral gastroenteritis, medication side-effect, and irritable bowel syndrome. Given the temporal relationship between the start of Effexor and beginning of symptoms, the patient may not be tolerating this medication well. Given the absence of other symptoms however, it is also likely that this is viral gastroenteritis. Patient was advised to start prescription strength Imodium and continue to monitor symptoms. If diarrhea persists over the next 3 days, he should discontinue Effexor and we will discuss other treatment options.    No orders of the defined types were placed in this encounter.  No orders of the defined types were placed in this encounter.    Discussed warning signs or symptoms. Please see discharge instructions. Patient expresses understanding.  I spent 25 minutes with this patient, greater than 50% was face-to-face time counseling regarding differential diagnosis treatment options a backup plan.

## 2017-05-02 NOTE — Patient Instructions (Signed)
Thank you for coming in today. Take over the counter Imodium (Loperamide) as on the box for diarrhea.  Continue Effexor.  If symptoms do not improve by Saturday STOP Effexor.  We will likely go to [facility] or the medicine recommend by your therapist.  Make sure to send me that INFO if you can.   Loperamide tablets or capsules What is this medicine? LOPERAMIDE (loe PER a mide) is used to treat diarrhea. This medicine may be used for other purposes; ask your health care provider or pharmacist if you have questions. COMMON BRAND NAME(S): Anti-Diarrheal, Imodium A-D, K-Pek II What should I tell my health care provider before I take this medicine? They need to know if you have any of these conditions: -a black or bloody stool -bacterial food poisoning -colitis or mucus in your stool -currently taking an antibiotic medication for an infection -fever -liver disease -severe abdominal pain, swelling or bulging -an unusual or allergic reaction to loperamide, other medicines, foods, dyes, or preservatives -pregnant or trying to get pregnant -breast-feeding How should I use this medicine? Take this medicine by mouth with a glass of water. Follow the directions on the prescription label. Take your doses at regular intervals. Do not take your medicine more often than directed. Talk to your pediatrician regarding the use of this medicine in children. Special care may be needed. Overdosage: If you think you have taken too much of this medicine contact a poison control center or emergency room at once. NOTE: This medicine is only for you. Do not share this medicine with others. What if I miss a dose? This does not apply. This medicine is not for regular use. Only take this medicine while you continue to have loose bowel movements. Do not take more medicine than recommended by the packaging label or by your healthcare professional. What may interact with this medicine? Do not take this medicine with  any of the following medications: - alosetron This medicine may also interact with the following medications: -cimetidine -clarithromycin -erythromycin -gemfibrozil -itraconazole -ketoconazole -quinidine -quinine -ranitidine -ritonavir -saquinavir This list may not describe all possible interactions. Give your health care provider a list of all the medicines, herbs, non-prescription drugs, or dietary supplements you use. Also tell them if you smoke, drink alcohol, or use illegal drugs. Some items may interact with your medicine. What should I watch for while using this medicine? Do not take this medicine for more than 2 days without asking your doctor or health care professional. Do not use doses higher than those prescribed by your doctor or listed on the label. Check with your doctor or health care professional right away if you develop a fever, severe abdominal pain, swelling or bulging, or if you have have bloody/black diarrhea or stools. You may get drowsy or dizzy. Do not drive, use machinery, or do anything that needs mental alertness until you know how this medicine affects you. Do not stand or sit up quickly, especially if you are an older patient. This reduces the risk of dizzy or fainting spells. Alcohol can increase possible drowsiness and dizziness. Avoid alcoholic drinks. Your mouth may get dry. Chewing sugarless gum or sucking hard candy, and drinking plenty of water may help. Contact your doctor if the problem does not go away or is severe. Drinking plenty of water can also help prevent dehydration that can occur with diarrhea. Elderly patients may have a more variable response to the effects of this medicine, and are more susceptible to the effects of  dehydration. What side effects may I notice from receiving this medicine? Side effects that you should report to your doctor or health care professional as soon as possible: - allergic reactions like skin rash, itching or hives,  swelling of the face, lips, or tongue -bloated, swollen feeling in your abdomen -blurred vision -loss of appetite -signs and symptoms of a dangerous change in heartbeat or heart rhythm like chest pain; dizziness; fast or irregular heartbeat palpitations; feeling faint or lightheaded, falls; breathing problems -stomach pain Side effects that usually do not require medical attention (report to your doctor or health care professional if they continue or are bothersome): - constipation -drowsiness or dizziness -dry mouth -nausea, vomiting This list may not describe all possible side effects. Call your doctor for medical advice about side effects. You may report side effects to FDA at 1-800-FDA-1088. Where should I keep my medicine? Keep out of the reach of children. Store at room temperature between 15 and 25 degrees C (59 and 77 degrees F). Keep container tightly closed. Throw away any unused medicine after the expiration date. NOTE: This sheet is a summary. It may not cover all possible information. If you have questions about this medicine, talk to your doctor, pharmacist, or health care provider.  2018 Elsevier/Gold Standard (2015-02-15 15:29:29)   Vortioxetine oral tablet What is this medicine? Vortioxetine (vor tee [employer] e teen) is used to treat depression. This medicine may be used for other purposes; ask your health care provider or pharmacist if you have questions. COMMON BRAND NAME(S): BRINTELLIX, Trintellix What should I tell my health care provider before I take this medicine? They need to know if you have any of these conditions: -bipolar disorder or a family history of bipolar disorder -bleeding disorders -drink alcohol -glaucoma -liver disease -low levels of sodium in the blood -seizures -suicidal thoughts, plans, or attempt; a previous suicide attempt by you or a family member -take medicines that treat or prevent blood clots -an unusual or allergic reaction to  vortioxetine, other medicines, foods, dyes, or preservatives -pregnant or trying to get pregnant -breast-feeding How should I use this medicine? Take this medicine by mouth with a glass of water. Follow the directions on the prescription label. You can take it with or without food. If it upsets your stomach, take it with food. Take your medicine at regular intervals. Do not take it more often than directed. Do not stop taking this medicine suddenly except upon the advice of your doctor. Stopping this medicine too quickly may cause serious side effects or your condition may worsen. A special MedGuide will be given to you by the pharmacist with each prescription and refill. Be sure to read this information carefully each time. Talk to your pediatrician regarding the use of this medicine in children. Special care may be needed. Overdosage: If you think you have taken too much of this medicine contact a poison control center or emergency room at once. NOTE: This medicine is only for you. Do not share this medicine with others. What if I miss a dose? If you miss a dose, take it as soon as you can. If it is almost time for your next dose, take only that dose. Do not take double or extra doses. What may interact with this medicine? Do not take this medicine with any of the following medications: -linezolid -MAOIs like Carbex, Eldepryl, Marplan, Nardil, and Parnate -methylene blue (injected into a vein) This medicine may also interact with the following medications: -alcohol -aspirin and  aspirin-like medicines -carbamazepine -certain medicines for depression, anxiety, or psychotic disturbances -certain medicines for migraine headache like almotriptan, eletriptan, frovatriptan, naratriptan, rizatriptan, sumatriptan, zolmitriptan -diuretics -fentanyl -furazolidone -isoniazid -medicines that treat or prevent blood clots like warfarin, enoxaparin, and dalteparin -NSAIDs, medicines for pain and  inflammation, like ibuprofen or naproxen -phenytoin -procarbazine -quinidine -rasagiline -rifampin -supplements like St. John's wort, kava kava, valerian -tramadol -tryptophan This list may not describe all possible interactions. Give your health care provider a list of all the medicines, herbs, non-prescription drugs, or dietary supplements you use. Also tell them if you smoke, drink alcohol, or use illegal drugs. Some items may interact with your medicine. What should I watch for while using this medicine? Tell your doctor if your symptoms do not get better or if they get worse. Visit your doctor or health care professional for regular checks on your progress. Because it may take several weeks to see the full effects of this medicine, it is important to continue your treatment as prescribed by your doctor. Patients and their families should watch out for new or worsening thoughts of suicide or depression. Also watch out for sudden changes in feelings such as feeling anxious, agitated, panicky, irritable, hostile, aggressive, impulsive, severely restless, overly excited and hyperactive, or not being able to sleep. If this happens, especially at the beginning of treatment or after a change in dose, call your health care professional. Bonita QuinYou may get drowsy or dizzy. Do not drive, use machinery, or do anything that needs mental alertness until you know how this medicine affects you. Do not stand or sit up quickly, especially if you are an older patient. This reduces the risk of dizzy or fainting spells. Alcohol may interfere with the effect of this medicine. Avoid alcoholic drinks. Your mouth may get dry. Chewing sugarless gum or sucking hard candy, and drinking plenty of water may help. Contact your doctor if the problem does not go away or is severe. What side effects may I notice from receiving this medicine? Side effects that you should report to your doctor or health care professional as soon as  possible: -allergic reactions like skin rash, itching or hives, swelling of the face, lips, or tongue -anxious -black, tarry stools -changes in vision -confusion -elevated mood, decreased need for sleep, racing thoughts, impulsive behavior -eye pain -fast, irregular heartbeat -feeling faint or lightheaded, falls -feeling agitated, angry, or irritable -hallucination, loss of contact with reality -loss of balance or coordination -loss of memory -painful or prolonged erections -restlessness, pacing, inability to keep still -seizures -stiff muscles -suicidal thoughts or other mood changes -trouble sleeping -unusual bleeding or bruising -unusually weak or tired -vomiting Side effects that usually do not require medical attention (report to your doctor or health care professional if they continue or are bothersome): -change in appetite or weight -change in sex drive or performance -constipation -dizziness -dry mouth -nausea This list may not describe all possible side effects. Call your doctor for medical advice about side effects. You may report side effects to FDA at 1-800-FDA-1088. Where should I keep my medicine? Keep out of the reach of children. Store at room temperature between 15 and 30 degrees C (59 and 86 degrees F). Throw away any unused medicine after the expiration date. NOTE: This sheet is a summary. It may not cover all possible information. If you have questions about this medicine, talk to your doctor, pharmacist, or health care provider.  2018 Elsevier/Gold Standard (2016-01-19 16:45:13)    Viral Gastroenteritis, Adult Viral gastroenteritis  is also known as the stomach flu. This condition is caused by various viruses. These viruses can be passed from person to person very easily (are very contagious). This condition may affect your stomach, small intestine, and large intestine. It can cause sudden watery diarrhea, fever, and vomiting. Diarrhea and vomiting can  make you feel weak and cause you to become dehydrated. You may not be able to keep fluids down. Dehydration can make you tired and thirsty, cause you to have a dry mouth, and decrease how often you urinate. Older adults and people with other diseases or a weak immune system are at higher risk for dehydration. It is important to replace the fluids that you lose from diarrhea and vomiting. If you become severely dehydrated, you may need to get fluids through an IV tube. What are the causes? Gastroenteritis is caused by various viruses, including rotavirus and norovirus. Norovirus is the most common cause in adults. You can get sick by eating food, drinking water, or touching a surface contaminated with one of these viruses. You can also get sick from sharing utensils or other personal items with an infected person. What increases the risk? This condition is more likely to develop in people:  Who have a weak defense system (immune system).  Who live with one or more children who are younger than 31 years old.  Who live in a nursing home.  Who go on cruise ships.  What are the signs or symptoms? Symptoms of this condition start suddenly 1-2 days after exposure to a virus. Symptoms may last a few days or as long as a week. The most common symptoms are watery diarrhea and vomiting. Other symptoms include:  Fever.  Headache.  Fatigue.  Pain in the abdomen.  Chills.  Weakness.  Nausea.  Muscle aches.  Loss of appetite.  How is this diagnosed? This condition is diagnosed with a medical history and physical exam. You may also have a stool test to check for viruses or other infections. How is this treated? This condition typically goes away on its own. The focus of treatment is to restore lost fluids (rehydration). Your health care provider may recommend that you take an oral rehydration solution (ORS) to replace important salts and minerals (electrolytes) in your body. Severe cases of  this condition may require giving fluids through an IV tube. Treatment may also include medicine to help with your symptoms. Follow these instructions at home: Follow instructions from your health care provider about how to care for yourself at home. Eating and drinking Follow these recommendations as told by your health care provider:  Take an ORS. This is a drink that is sold at pharmacies and retail stores.  Drink clear fluids in small amounts as you are able. Clear fluids include water, ice chips, diluted fruit juice, and low-calorie sports drinks.  Eat bland, easy-to-digest foods in small amounts as you are able. These foods include bananas, applesauce, rice, lean meats, toast, and crackers.  Avoid fluids that contain a lot of sugar or caffeine, such as energy drinks, sports drinks, and soda.  Avoid alcohol.  Avoid spicy or fatty foods.  General instructions   Drink enough fluid to keep your urine clear or pale yellow.  Wash your hands often. If soap and water are not available, use hand sanitizer.  Make sure that all people in your household wash their hands well and often.  Take over-the-counter and prescription medicines only as told by your health care provider.  Rest  at home while you recover.  Watch your condition for any changes.  Take a warm bath to relieve any burning or pain from frequent diarrhea episodes.  Keep all follow-up visits as told by your health care provider. This is important. Contact a health care provider if:  You cannot keep fluids down.  Your symptoms get worse.  You have new symptoms.  You feel light-headed or dizzy.  You have muscle cramps. Get help right away if:  You have chest pain.  You feel extremely weak or you faint.  You see blood in your vomit.  Your vomit looks like coffee grounds.  You have bloody or black stools or stools that look like tar.  You have a severe headache, a stiff neck, or both.  You have a  rash.  You have severe pain, cramping, or bloating in your abdomen.  You have trouble breathing or you are breathing very quickly.  Your heart is beating very quickly.  Your skin feels cold and clammy.  You feel confused.  You have pain when you urinate.  You have signs of dehydration, such as: ? Dark urine, very little urine, or no urine. ? Cracked lips. ? Dry mouth. ? Sunken eyes. ? Sleepiness. ? Weakness. This information is not intended to replace advice given to you by your health care provider. Make sure you discuss any questions you have with your health care provider. Document Released: 08/20/2005 Document Revised: 02/01/2016 Document Reviewed: 04/26/2015 Elsevier Interactive Patient Education  2017 [employer].

## 2017-05-28 ENCOUNTER — Ambulatory Visit (INDEPENDENT_AMBULATORY_CARE_PROVIDER_SITE_OTHER): Payer: BLUE CROSS/BLUE SHIELD | Admitting: Family Medicine

## 2017-05-28 ENCOUNTER — Encounter: Payer: Self-pay | Admitting: Family Medicine

## 2017-05-28 ENCOUNTER — Telehealth: Payer: Self-pay | Admitting: Family Medicine

## 2017-05-28 ENCOUNTER — Other Ambulatory Visit: Payer: BLUE CROSS/BLUE SHIELD

## 2017-05-28 VITALS — BP 117/76 | HR 81 | Wt 253.0 lb

## 2017-05-28 DIAGNOSIS — R942 Abnormal results of pulmonary function studies: Secondary | ICD-10-CM

## 2017-05-28 DIAGNOSIS — R0602 Shortness of breath: Secondary | ICD-10-CM

## 2017-05-28 DIAGNOSIS — F411 Generalized anxiety disorder: Secondary | ICD-10-CM

## 2017-05-28 MED ORDER — VENLAFAXINE HCL ER 75 MG PO CP24: 75.0000 mg | ORAL_CAPSULE | Freq: Every day | ORAL | 3 refills | Status: DC

## 2017-05-28 MED ORDER — ALBUTEROL SULFATE (2.5 MG/3ML) 0.083% IN NEBU
2.5000 mg | INHALATION_SOLUTION | Freq: Once | RESPIRATORY_TRACT | Status: AC
  Administered 2017-05-28: 2.5 mg via RESPIRATORY_TRACT

## 2017-05-28 NOTE — Patient Instructions (Signed)
Thank you for coming in today. You should hear from [facility].  Continue Effexor.  Recheck with me as needed.    Pulmonary Function Tests Pulmonary function tests (PFTs) are used to measure how well your lungs work, find out what is causing your lung problems, and figure out the best treatment for you. You may have PFTs:  When you have an illness involving the lungs.  To follow changes in your lung function over time if you have a chronic lung disease.  If you are an IT trainer. This checks the effects of being exposed to chemicals over a long period of time.  To check lung function before having surgery or other procedures.  To check your lungs if you smoke.  To check if prescribed medicines or treatments are helping your lungs.  Your results will be compared to the expected lung function of someone with healthy lungs who is similar to you in:  Age.  Gender.  Height.  Weight.  Race or ethnicity.  This is done to show how your lungs compare to normal lung function (percent predicted). This is how your health care provider knows if your lung function is normal or not. If you have had PFTs done before, your health care provider will compare your current results with past results. This shows if your lung function is better, worse, or the same as before. Tell a health care provider about:  Any allergies you have.  All medicines you are taking, including inhaler or nebulizer medicines, vitamins, herbs, eye drops, creams, and over-the-counter medicines.  Any blood disorders you have.  Any surgeries you have had, especially recent eye surgery, abdominal surgery, or chest surgery. These can make PFTs difficult or unsafe.  Any medical conditions you have, including chest pain or heart problems, tuberculosis, or respiratory infections such as pneumonia, a cold, or the flu.  Any fear of being in closed spaces (claustrophobia). Some of your tests  may be in a closed space. What are the risks? Generally, this is a safe procedure. However, problems may occur, including:  Light-headedness due to over-breathing (hyperventilation).  An asthma attack from deep breathing.  A collapsed lung.  What happens before the procedure?  Take over-the-counter and prescription medicines only as told by your health care provider. If you take inhaler or nebulizer medicines, ask your health care provider which medicines you should take on the day of your testing. Some inhaler medicines may interfere with PFTs if they are taken shortly before the tests.  Follow your health care provider's instructions on eating and drinking restrictions. This may include avoiding eating large meals and drinking alcohol before the testing.  Do not use any products that contain nicotine or tobacco, such as cigarettes and e-cigarettes. If you need help quitting, ask your health care provider.  Wear comfortable clothing that will not interfere with breathing. What happens during the procedure?  You will be given a soft nose clip to wear. This is done so all of your breaths will go through your mouth instead of your nose.  You will be given a germ-free (sterile) mouthpiece. It will be attached to a machine that measures your breathing (spirometer).  You will be asked to do various breathing maneuvers. The maneuvers will be done by breathing in (inhaling) and breathing out (exhaling). You may be asked to repeat the maneuvers several times before the testing is done.  It is important to follow the instructions exactly to get accurate results. Make  sure to blow as hard and as fast as you can when you are told to do so.  You may be given a medicine that makes the small air passages in your lungs larger (bronchodilator) after testing has been done. This medicine will make it easier for you to breathe.  The tests will be repeated after the bronchodilator has taken effect.  You  will be monitored carefully during the procedure for faintness, dizziness, trouble breathing, or any other problems. The procedure may vary among health care providers and hospitals. What happens after the procedure?  It is up to you to get your test results. Ask your health care provider, or the department that is doing the tests, when your results will be ready. After you have received your test results, talk with your health care provider about treatment options, if necessary. Summary  Pulmonary function tests (PFTs) are used to measure how well your lungs work, find out what is causing your lung problems, and figure out the best treatment for you.  Wear comfortable clothing that will not interfere with breathing.  It is up to you to get your test results. After you have received them, talk with your health care provider about treatment options, if necessary. This information is not intended to replace advice given to you by your health care provider. Make sure you discuss any questions you have with your health care provider. Document Released: 04/12/2004 Document Revised: 07/12/2016 Document Reviewed: 07/12/2016 Elsevier Interactive Patient Education  2017 [employer].

## 2017-05-28 NOTE — Progress Notes (Signed)
Gaurav Baldree is a 26 y.o. male who presents to [name]: Primary Care Sports Medicine today for anxiety and abnormal pulmonary function test.  Anxiety: Patient has been seen several times for anxiety and depression. He was switched off of Zoloft at the last visit due to vertigo symptoms. He is tolerating Effexor quite well and notes that it's very effective at controlling his symptoms without any obnoxious side effects. He is quite satisfied with how things are going. He denies any SI or HI.  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 He denies any personal history of asthma or wheezing or severe shortness of breath with exertion. He notes he was exposed to secondhand smoke as a child but he cannot think of any reasons why his pulmonary function tests would have been abnormal.   Past Medical History:  Diagnosis Date  . 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 Generalized anxiety disorder 03/29/2015   No past surgical history on file. Social History  Substance Use Topics  . Smoking status: Never Smoker  . Smokeless tobacco: Never Used  . Alcohol use 0.0 oz/week   family history is not on file.  ROS as above:  Medications: Current Outpatient Prescriptions  Medication Sig Dispense Refill  . venlafaxine XR (EFFEXOR XR) 75 MG 24 hr capsule Take 1 capsule (75 mg total) by mouth daily with breakfast. 90 capsule 3   No current facility-administered medications for this visit.    No Known Allergies  Health Maintenance Health Maintenance  Topic Date Due  . HIV Screening  11/25/2004  . INFLUENZA VACCINE  04/26/2018 (Originally 04/03/2017)  . TETANUS/TDAP  04/26/2018 (Originally 11/25/2008)     Exam:  BP 117/76   Pulse 81   Wt 253 lb (114.8 kg)   BMI 34.31 kg/m  Gen: Well NAD HEENT:  EOMI,  MMM Lungs: Normal work of breathing. CTABL Heart: RRR no MRG Abd: NABS, Soft. Nondistended, Nontender Exts: Brisk capillary refill, warm and well perfused.  Psych alert and oriented normal speech thought process and affect.  GAD 7 : Generalized Anxiety Score 05/28/2017 04/26/2017 02/19/2017 11/23/2016  Nervous, Anxious, on Edge Control/stop worrying 0 Worry too much - different things 0 1 0 2  Trouble relaxing 0 1 0 1  Restless 0 2 0 2  Easily annoyed or irritable 0 2 0 2  Afraid - awful might happen 0 1 0 1  Total GAD 7 Score Depression screen [facility] 2/9 05/28/2017 04/26/2017 02/19/2017 12/07/2016 11/23/2016  Decreased Interest 0 1 0 1 3  Down, Depressed, Hopeless 0 1 0 1 3  PHQ - 2 Score 0 2 0 2 6  Altered sleeping - 2  Tired, decreased energy 0 1 0 - 3  Change in appetite 0 2 1 - 3  Feeling bad or failure about yourself  1 0 0 - 3  Trouble concentrating - 1 0 - 1  Moving slowly or fidgety/restless 0 0 0 - 2  Suicidal thoughts 0 0 0 - 0  PHQ-9 Score - 20        No results found for this or any previous visit (from the past 72 hour(s)). No results found.    Assessment and Plan: 26 y.o. male with  Anxiety: Doing quite well with Effexor.  Plan to continue current regimen and recheck every 6-12 months for this issue.  Abnormal pulmonary function tests: Unclear etiology. Patient has significant poor performance on the pulmonary function test the beyond what I would expect from a man in his health.  He had no improvement with albuterol treatment. I think it's reasonable to have evaluation with pulmonology for this issue for more specific testing and potential treatment. He plans on becoming a Engineering geologist and this may be a limiting factor. Plan to refer to pulmonology.   Orders Placed This Encounter  Procedures  . Ambulatory referral to Pulmonology    Referral Priority:   Routine    Referral Type:   Consultation     Referral Reason:   Specialty Services Required    Requested Specialty:   Pulmonary Disease    Number of Visits Requested:   1  . Spirometry: Pre & Post Eval   Meds ordered this encounter  Medications  . venlafaxine XR (EFFEXOR XR) 75 MG 24 hr capsule    Sig: Take 1 capsule (75 mg total) by mouth daily with breakfast.    Dispense:  90 capsule    Refill:  3  . albuterol (PROVENTIL) (2.5 MG/3ML) 0.083% nebulizer solution 2.5 mg     Discussed warning signs or symptoms. Please see discharge instructions. Patient expresses understanding.  I spent 25 minutes with this patient, greater than 50% was face-to-face time counseling regarding ddx and treatment plan.

## 2017-05-28 NOTE — Telephone Encounter (Signed)
Pt was scheduled today for spirometry and a follow up OV with PCP. Per PCP, no need for spirometry. Attempted to contact Pt to see if he requested spirometry, no answer and no VM. Spirometry appointment cancelled, appointment with PCP remains.

## 2017-06-06 ENCOUNTER — Ambulatory Visit (HOSPITAL_BASED_OUTPATIENT_CLINIC_OR_DEPARTMENT_OTHER)
Admission: RE | Admit: 2017-06-06 | Discharge: 2017-06-06 | Disposition: A | Discharge status: Home / Self Care | Payer: BLUE CROSS/BLUE SHIELD | Source: Ambulatory Visit | Attending: Pulmonary Disease | Admitting: Pulmonary Disease

## 2017-06-06 ENCOUNTER — Encounter: Payer: Self-pay | Admitting: Pulmonary Disease

## 2017-06-06 ENCOUNTER — Ambulatory Visit (INDEPENDENT_AMBULATORY_CARE_PROVIDER_SITE_OTHER): Payer: BLUE CROSS/BLUE SHIELD | Admitting: Pulmonary Disease

## 2017-06-06 DIAGNOSIS — R942 Abnormal results of pulmonary function studies: Secondary | ICD-10-CM | POA: Diagnosis not present

## 2017-06-06 NOTE — Addendum Note (Signed)
Addended by: Maurene Capes on: 06/06/2017 09:36 AM   Modules accepted: Orders

## 2017-06-06 NOTE — Progress Notes (Signed)
   Subjective:    Patient ID: Noah Petersen, male    DOB: [birth year], 26 y.o.   MRN: [id number]  HPI  26 year old never smoker presents for evaluation of abnormal pulmonary function test. 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 He had an abnormal screening test and then spirometry was repeated by his PCP Spirometry from 9/25 showed a ratio of 67, FEV1 of 3.39-71%, FVC of 5.03-86%. There was no bronchodilator response  He denies history of asthma or wheezing or seasonal allergies. He denies shortness of breath with exertion. 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 He does report exposure to secondhand smoke as a child. He denies recurrent chest colds or cough. He denies any episode of pneumonia as a child. He is maintained on Zoloft for anxiety and depression and this has been changed to Effexor.  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 He is a lifetime never smoker. He does drink alcohol about 2-3 beers per week   Past Medical History:  Diagnosis Date  . 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 Generalized anxiety disorder 03/29/2015    No past surgical history on file.  No Known Allergies  Social History   Social History  . Marital status: Married    Spouse name: N/A  . Number of children: N/A  . Years of education: N/A   Occupational History  . Not on file.   Social History Main Topics  . Smoking status: Never Smoker  . Smokeless tobacco: Never Used  . Alcohol use 0.0 oz/week  . Drug use: No  . Sexual activity: Yes    Partners: Female   Other Topics Concern  . Not on file   Social History Narrative  . No narrative on file       No family history on file.   Review of Systems Constitutional: negative for anorexia, fevers  and sweats  Eyes: negative for irritation, redness and visual disturbance  Ears, nose, mouth, throat, and face: negative for earaches, epistaxis, nasal congestion and sore throat  Respiratory: negative for cough, dyspnea on exertion, sputum and wheezing  Cardiovascular: negative for chest pain, dyspnea, lower extremity edema, orthopnea, palpitations and syncope  Gastrointestinal: negative for abdominal pain, constipation, diarrhea, melena, nausea and vomiting  Genitourinary:negative for dysuria, frequency and hematuria  Hematologic/lymphatic: negative for bleeding, easy bruising and lymphadenopathy  Musculoskeletal:negative for arthralgias, muscle weakness and stiff joints  Neurological: negative for coordination problems, gait problems, headaches and weakness  Endocrine: negative for diabetic symptoms including polydipsia, polyuria and weight loss     Objective:   Physical Exam  Gen. Pleasant, obese, in no distress, normal affect ENT - no lesions, no post nasal drip, class 2 airway Neck: No JVD, no thyromegaly, no carotid bruits Lungs: no use of accessory muscles, no dullness to percussion, decreased without rales or rhonchi  Cardiovascular: Rhythm regular, heart sounds  normal, no murmurs or gallops, no peripheral edema Abdomen: soft and non-tender, no hepatosplenomegaly, BS normal. Musculoskeletal: No deformities, no cyanosis or clubbing Neuro:  alert, non focal, no tremors       Assessment & Plan:

## 2017-06-06 NOTE — Patient Instructions (Signed)
Chest x-ray today. 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

## 2017-06-06 NOTE — Assessment & Plan Note (Signed)
Surprisingly spirometry does show some evidence of airway obstruction. There is no reversibility with bronchodilator. He does not provide any history of asthma or wheezing. He is not limited by shortness of breath in any way. 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 We would repeat his spirometry in 6 months. He will contact us sooner if he develops any symptoms of wheezing or shortness of breath. Chest x-ray will be obtained today for completion

## 2017-06-26 ENCOUNTER — Other Ambulatory Visit: Payer: Self-pay | Admitting: Family Medicine

## 2017-09-09 ENCOUNTER — Ambulatory Visit: Payer: BLUE CROSS/BLUE SHIELD | Admitting: Family Medicine

## 2017-09-09 ENCOUNTER — Encounter: Payer: Self-pay | Admitting: Family Medicine

## 2017-09-09 VITALS — BP 134/81 | HR 80 | Ht 72.0 in | Wt 266.0 lb

## 2017-09-09 DIAGNOSIS — R6882 Decreased libido: Secondary | ICD-10-CM | POA: Diagnosis not present

## 2017-09-09 DIAGNOSIS — R942 Abnormal results of pulmonary function studies: Secondary | ICD-10-CM

## 2017-09-09 DIAGNOSIS — F411 Generalized anxiety disorder: Secondary | ICD-10-CM | POA: Diagnosis not present

## 2017-09-09 DIAGNOSIS — F325 Major depressive disorder, single episode, in full remission: Secondary | ICD-10-CM

## 2017-09-09 DIAGNOSIS — E669 Obesity, unspecified: Secondary | ICD-10-CM

## 2017-09-09 MED ORDER — VORTIOXETINE HBR 10 MG PO TABS: 10.0000 mg | ORAL_TABLET | Freq: Every day | ORAL | 0 refills | Status: DC

## 2017-09-09 NOTE — Patient Instructions (Signed)
Thank you for coming in today. Get labs today.  STOP Effexor Start Trintellix.  Let me know how you are doing in a few weeks via mychart message or phone call.  If not doing well return.  Get labs today.   Recheck as needed.

## 2017-09-09 NOTE — Progress Notes (Signed)
       Noah Petersen is a 27 y.o. male who [facility] [facility] today for anxiety.  Thereasa DistanceRodney has anxiety and depression which is currently managed with extended release defects or.  He notes this works quite well but he continues to experience sexual side effects.  He notes some reduced libido.  He had previously difficulty with delayed orgasm with Zoloft and had trouble with Viibryd not being very effective.    Past Medical History:  Diagnosis Date  . 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 Generalized anxiety disorder 03/29/2015   No past surgical history on file. Social History   Tobacco Use  . Smoking status: Never Smoker  . Smokeless tobacco: Never Used  Substance Use Topics  . Alcohol use: Yes    Alcohol/week: 0.0 oz   family history is not on file.  ROS as above:  Medications: Current Outpatient Medications  Medication Sig Dispense Refill  . venlafaxine XR (EFFEXOR-XR) 75 MG 24 hr capsule TAKE 1 CAPSULE(75 MG) BY MOUTH DAILY WITH BREAKFAST 30 capsule 6  . vortioxetine HBr (TRINTELLIX) 10 MG TABS Take 1 tablet (10 mg total) by mouth daily. 90 tablet 0   No current facility-administered medications for this visit.    No Known Allergies  Health Maintenance Health Maintenance  Topic Date Due  . HIV Screening  11/25/2004  . INFLUENZA VACCINE  04/26/2018 (Originally 04/03/2017)  . TETANUS/TDAP  04/26/2018 (Originally 11/25/2008)     Exam:  BP 134/81   Pulse 80   Ht 6' (1.829 m)   Wt 266 lb (120.7 kg)   BMI 36.08 kg/m   Wt Readings from Last 5 Encounters:  09/09/17 266 lb (120.7 kg)  06/06/17 254 lb 0.6 oz (115.2 kg)  05/28/17 253 lb (114.8 kg)  05/02/17 250 lb (113.4 kg)  04/26/17 252 lb (114.3 kg)    Gen: Well NAD Psych: Alert and oriented normal speech thought process and  No results found for this or any previous visit  (from the past 72 hour(s)). No results found.    Assessment and Plan: 27 y.o. male with low libido in the setting of anxiety and depression control.  I think this is probably a medication side effect from Effexor or but other issues are possible.  Will complete the metabolic workup listed below.  We will also check fasting lipids as he is obese.  Plan to switch medicine to Trintellix 10mg  daily.  Send report in  Few weeks.    Orders Placed This Encounter  Procedures  . CBC  . COMPLETE METABOLIC PANEL WITH GFR  . Testosterone  . TSH  . Lipid Panel w/reflex Direct LDL   Meds ordered this encounter  Medications  . vortioxetine HBr (TRINTELLIX) 10 MG TABS    Sig: Take 1 tablet (10 mg total) by mouth daily.    Dispense:  90 tablet    Refill:  0     Discussed warning signs or symptoms. Please see discharge instructions. Patient expresses understanding.

## 2017-09-10 LAB — COMPLETE METABOLIC PANEL WITH GFR
AG RATIO: 1.6 (calc) (ref 1.0–2.5)
ALBUMIN MSPROF: 4.4 g/dL (ref 3.6–5.1)
ALKALINE PHOSPHATASE (APISO): 94 U/L (ref 40–115)
ALT: 23 U/L (ref 9–46)
AST: 15 U/L (ref 10–40)
BUN: 11 mg/dL (ref 7–25)
CO2: 31 mmol/L (ref 20–32)
Calcium: 9.5 mg/dL (ref 8.6–10.3)
Chloride: 101 mmol/L (ref 98–110)
Creat: 0.98 mg/dL (ref 0.60–1.35)
GFR, EST AFRICAN AMERICAN: 122 mL/min/{1.73_m2} (ref >=60)
GFR, Est Non African American: 105 mL/min/{1.73_m2} (ref >=60)
GLUCOSE: 99 mg/dL (ref 65–99)
Globulin: 2.7 g/dL (calc) (ref 1.9–3.7)
POTASSIUM: 4 mmol/L (ref 3.5–5.3)
Sodium: 139 mmol/L (ref 135–146)
TOTAL PROTEIN: 7.1 g/dL (ref 6.1–8.1)
Total Bilirubin: 0.9 mg/dL (ref 0.2–1.2)

## 2017-09-10 LAB — CBC
HCT: 46.2 % (ref 38.5–50.0)
HEMOGLOBIN: 15.5 g/dL (ref 13.2–17.1)
MCH: 29.2 pg (ref 27.0–33.0)
MCHC: 33.5 g/dL (ref 32.0–36.0)
MCV: 87 fL (ref 80.0–100.0)
MPV: 10.9 fL (ref 7.5–12.5)
PLATELETS: 227 10*3/uL (ref 140–400)
RBC: 5.31 10*6/uL (ref 4.20–5.80)
RDW: 12 % (ref 11.0–15.0)
WBC: 8.1 10*3/uL (ref 3.8–10.8)

## 2017-09-10 LAB — LIPID PANEL W/REFLEX DIRECT LDL
Cholesterol: 152 mg/dL (ref <200)
HDL: 52 mg/dL (ref >40)
LDL Cholesterol (Calc): 75 mg/dL (calc)
NON-HDL CHOLESTEROL (CALC): 100 mg/dL (ref <130)
Total CHOL/HDL Ratio: 2.9 (calc) (ref <5.0)
Triglycerides: 150 mg/dL — ABNORMAL HIGH (ref <150)

## 2017-09-10 LAB — TSH: TSH: 2.18 m[IU]/L (ref 0.40–4.50)

## 2017-09-10 LAB — TESTOSTERONE: Testosterone: 486 ng/dL (ref 250–827)

## 2017-09-24 ENCOUNTER — Encounter: Payer: Self-pay | Admitting: Family Medicine

## 2017-09-24 MED ORDER — VENLAFAXINE HCL ER 75 MG PO CP24: ORAL_CAPSULE | ORAL | 1 refills | Status: DC

## 2017-12-06 ENCOUNTER — Encounter: Payer: Self-pay | Admitting: Family Medicine

## 2018-01-07 ENCOUNTER — Telehealth: Payer: Self-pay | Admitting: Family Medicine

## 2018-01-07 NOTE — Telephone Encounter (Signed)
Grief: Had several family members die recently. He is expressing symptoms of depression and grief. Spoke with wife. He is scheduled for tomorrow.

## 2018-01-07 NOTE — Telephone Encounter (Signed)
Pt wife called to make patient an appointment and would like to speak with you. She stated they are going through some grieving and has concerns that the patient is showing signs of depression. She is on the patients DPR.  Hinda Lenis  [phone]

## 2018-01-07 NOTE — Telephone Encounter (Signed)
0

## 2018-01-08 ENCOUNTER — Encounter: Payer: Self-pay | Admitting: Family Medicine

## 2018-01-08 ENCOUNTER — Ambulatory Visit: Payer: BLUE CROSS/BLUE SHIELD | Admitting: Family Medicine

## 2018-01-08 VITALS — BP 123/83 | HR 73 | Wt 271.0 lb

## 2018-01-08 DIAGNOSIS — F325 Major depressive disorder, single episode, in full remission: Secondary | ICD-10-CM

## 2018-01-08 DIAGNOSIS — E669 Obesity, unspecified: Secondary | ICD-10-CM

## 2018-01-08 DIAGNOSIS — F411 Generalized anxiety disorder: Secondary | ICD-10-CM | POA: Diagnosis not present

## 2018-01-08 NOTE — Patient Instructions (Signed)
Thank you for coming in today. Pay attention to mood.  If your irritability worsens please let me know. We can consider medicine or therapy.  Work on healthy lifestyle for weight.  Recheck as needed.

## 2018-01-08 NOTE — Progress Notes (Signed)
Noah Petersen is a 27 y.o. male who presents to [facility]: Primary Care Sports Medicine today for follow up anxiety, depression and obesity.   Noah Petersen has a history of depression and anxiety. In the past he required medication and counseling. He had significant improvement and is not currently taking any medication. He notes that his wife thinks he is more irritable currently, He notes the his grandmother in law that he was very close to died about 1 month ago unexpectedly. He grieved initially but is feeling better now. He denies any trouble sleeping and is feeling pretty well. He does note some job stress and is contemplating a different job. He does not think he is very irritable and if at about his baseline.   Obesity: Noah Petersen has regained weight. Last year he improved his diet and exercise and lost weight. He notes that he has stopped those practices and the weight returned. He is interested in restarting healthy lifestyle.    Past Medical History:  Diagnosis Date  . 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 Generalized anxiety disorder 03/29/2015   No past surgical history on file. Social History   Tobacco Use  . Smoking status: Never Smoker  . Smokeless tobacco: Never Used  Substance Use Topics  . Alcohol use: Yes    Alcohol/week: 0.0 oz   family history is not on file.  ROS as above:  Medications: No current outpatient medications on file.   No current facility-administered medications for this visit.    No Known Allergies  Health Maintenance Health Maintenance  Topic Date Due  . HIV Screening  11/25/2004  . INFLUENZA VACCINE  04/26/2018 (Originally 04/03/2018)  . TETANUS/TDAP  04/26/2018 (Originally 11/25/2008)     Exam:  BP 123/83   Pulse 73   Wt 271 lb (122.9 kg)   BMI 36.75 kg/m   Wt Readings from Last 5 Encounters:  01/08/18 271 lb (122.9 kg)    09/09/17 266 lb (120.7 kg)  06/06/17 254 lb 0.6 oz (115.2 kg)  05/28/17 253 lb (114.8 kg)  05/02/17 250 lb (113.4 kg)    Gen: Well NAD HEENT: EOMI,  MMM Lungs: Normal work of breathing. CTABL Heart: RRR no MRG Abd: NABS, Soft. Nondistended, Nontender Exts: Brisk capillary refill, warm and well perfused.  Psych: Alert and oriented. Normal speech and thought process. No SI or HI.   Depression screen [facility] 2/9 01/09/2018 05/28/2017 04/26/2017 02/19/2017 12/07/2016  Decreased Interest 0 0 1 0 1  Down, Depressed, Hopeless 0 0 1 0 1  PHQ - 2 Score 0 0 2 0 2  Altered sleeping 0 -  Tired, decreased energy 1 0 1 0 -  Change in appetite 1 0 2 1 -  Feeling bad or failure about yourself  0 1 0 0 -  Trouble concentrating 0 - 1 0 -  Moving slowly or fidgety/restless - 0 0 0 -  Suicidal thoughts 0 0 0 0 -  PHQ-9 Score -  Difficult doing work/chores Not difficult at all - - - -   GAD 7 : Generalized Anxiety Score 01/09/2018 05/28/2017 04/26/2017 02/19/2017  Nervous, Anxious, on Edge Control/stop worrying 0 0 1 1  Worry too much - different things 0 0 1 0  Trouble relaxing 0 0 1 0  Restless 0 0 2 0  Easily annoyed or irritable 2 0 2 0  Afraid -  awful might happen 0 0 1 0  Total GAD 7 Score Anxiety Difficulty Not difficult at all - - -      Assessment and Plan: 27 y.o. male with  Mood: Stable doing well. Selim is recovering from grief. Plan for watchful waiting. If mood worsens or multiple people think he is irritable return. Recheck PRN.   Obesity: Discussed returning to prior healthy lifestyle.     No orders of the defined types were placed in this encounter.  No orders of the defined types were placed in this encounter.    Discussed warning signs or symptoms. Please see discharge instructions. Patient expresses understanding.

## 2018-01-27 IMAGING — DX DG CHEST 2V
2 series · 2 of 2 positions shown · non-contrast
Comparison: None.

CLINICAL DATA: Abnormal pulmonary function test.  Fire fighter.

EXAM:
CHEST  2 VIEW

[chest pa]
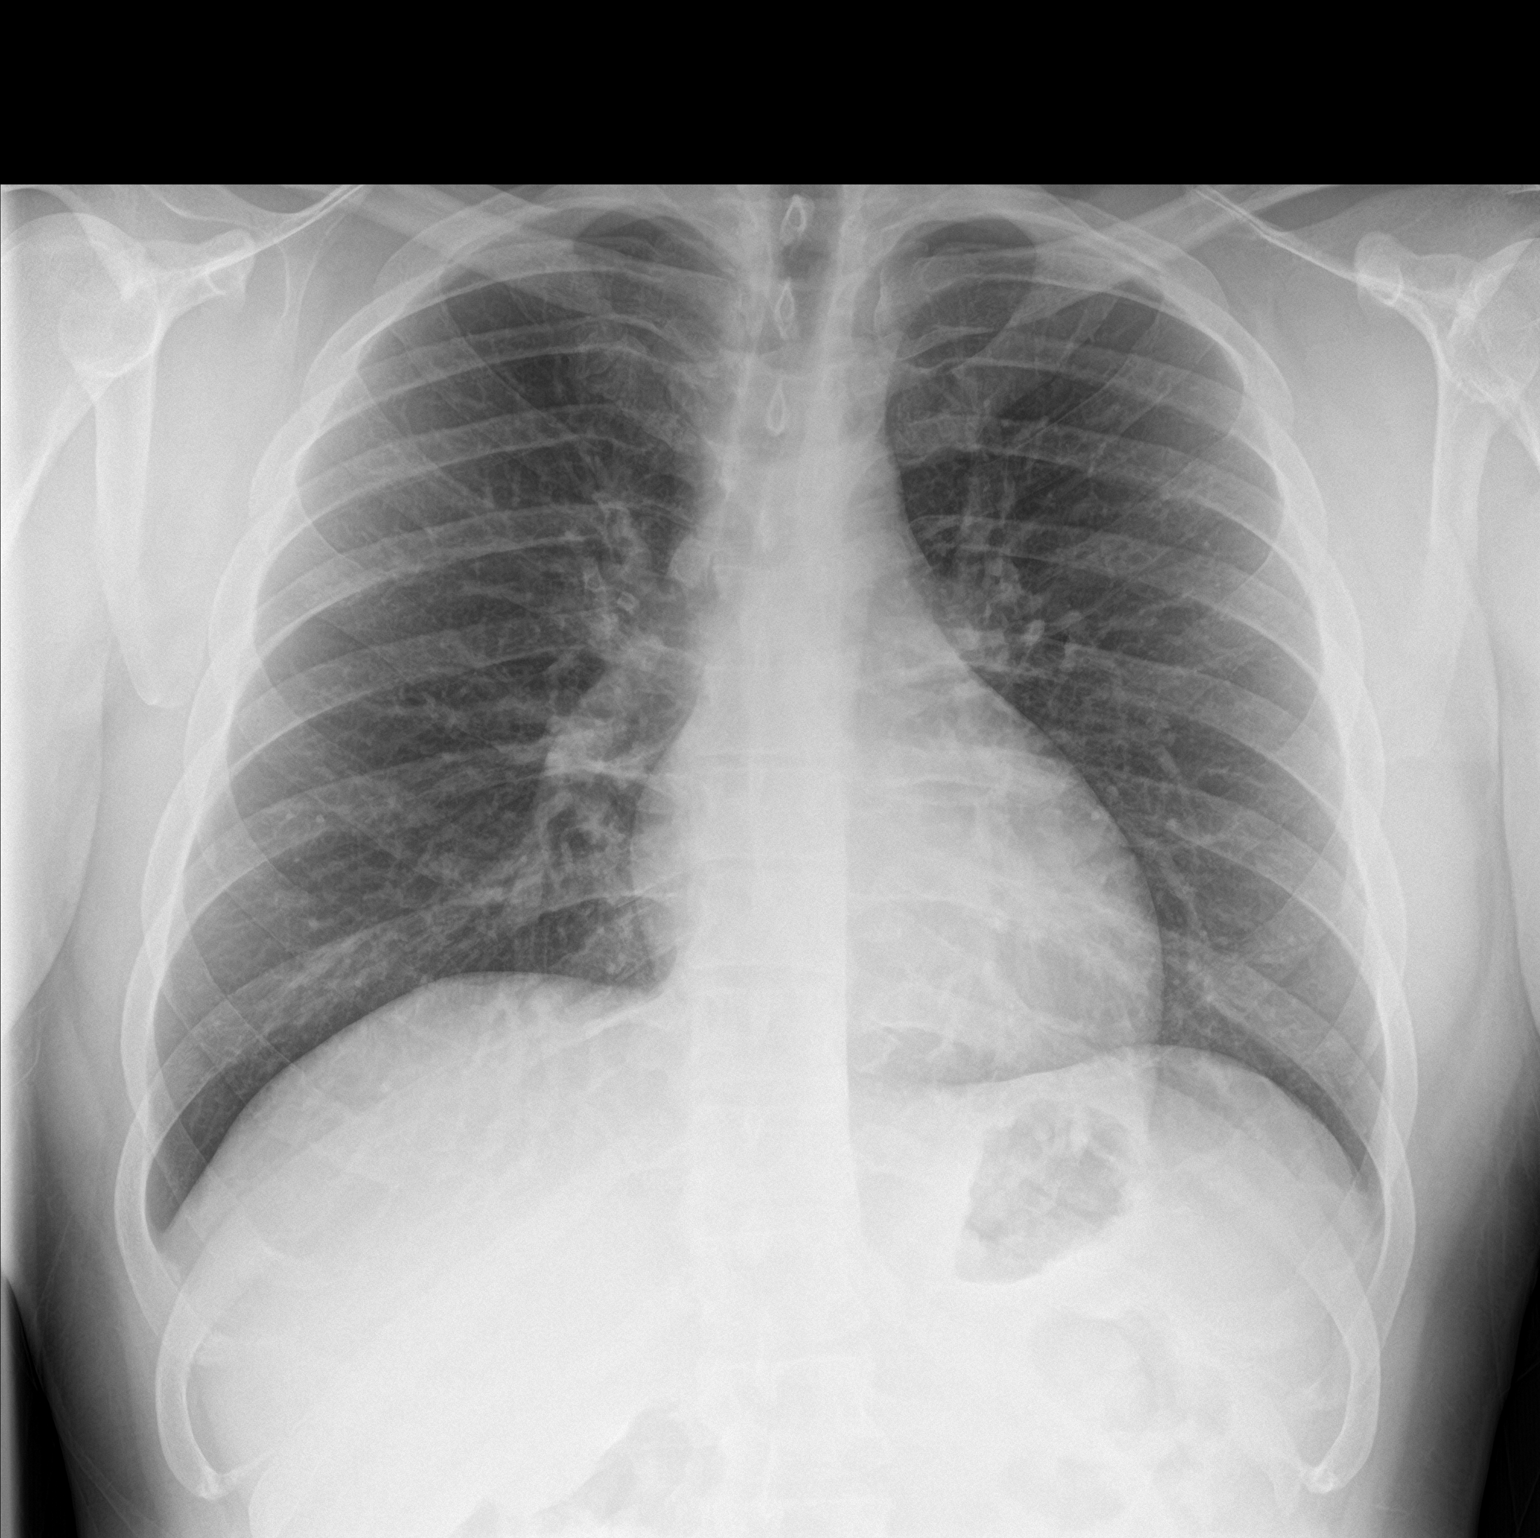

[chest lat]
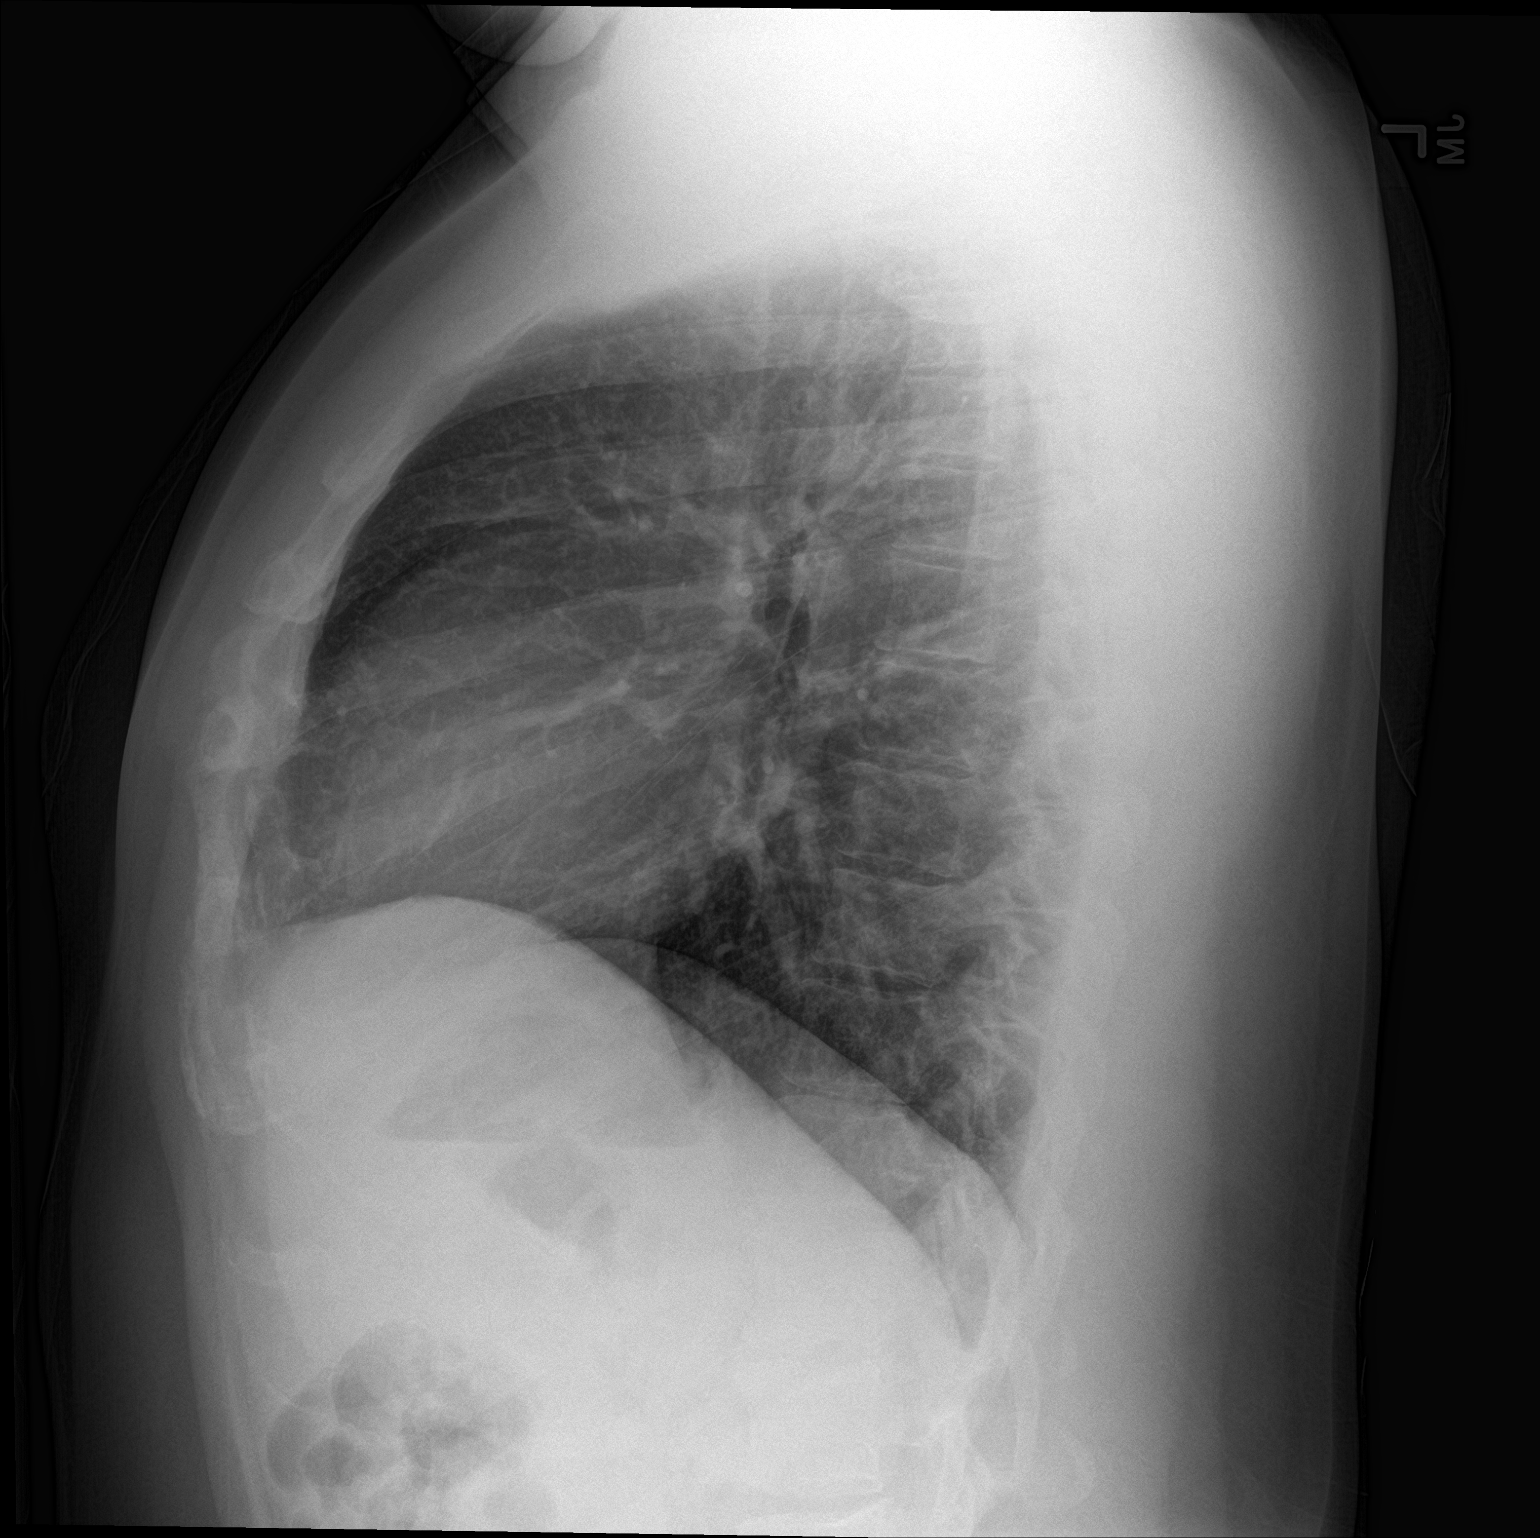

[2 of 2 positions shown; findings below may reference images not displayed]

FINDINGS: The heart size and mediastinal contours are within normal limits.
Both lungs are clear. The visualized skeletal structures are
unremarkable.
IMPRESSION: No active cardiopulmonary disease.

## 2018-02-24 ENCOUNTER — Encounter: Payer: Self-pay | Admitting: Emergency Medicine

## 2018-02-24 ENCOUNTER — Emergency Department
Admission: EM | Admit: 2018-02-24 | Discharge: 2018-02-24 | Disposition: A | Discharge status: Home / Self Care | Payer: BLUE CROSS/BLUE SHIELD | Source: Home / Self Care | Attending: Family Medicine | Admitting: Family Medicine

## 2018-02-24 ENCOUNTER — Other Ambulatory Visit: Payer: Self-pay

## 2018-02-24 DIAGNOSIS — H00012 Hordeolum externum right lower eyelid: Secondary | ICD-10-CM

## 2018-02-24 MED ORDER — ERYTHROMYCIN 5 MG/GM OP OINT: TOPICAL_OINTMENT | OPHTHALMIC | 0 refills | Status: DC

## 2018-02-24 NOTE — ED Provider Notes (Signed)
[facility]    CSN: [id number] Arrival date & time: 02/24/18  1831     History   Chief Complaint Chief Complaint  Patient presents with  . Eye Problem    HPI Noah Petersen is a 27 y.o. male.   HPI  Noah Petersen is a 27 y.o. 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 He was wearing eye protection but wonders if he still got something in his eye. No change in vision. Small amount of drainage. He has not tried anything for his symptoms. No hx of similar symptoms.   Past Medical History:  Diagnosis Date  . 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 Generalized anxiety disorder 03/29/2015    Patient Active Problem List   Diagnosis Date Noted  . Abnormal PFT 05/28/2017  . Vertigo 04/26/2017  . Obesity (BMI 30-39.9) 02/19/2017  . 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 Foot pain, bilateral 05/06/2015  . Generalized anxiety disorder 03/29/2015    History reviewed. No pertinent surgical history.     Home Medications    Prior to Admission medications   Medication Sig Start Date End Date Taking? Authorizing Provider  erythromycin ophthalmic ointment Place a 1/2 inch ribbon of ointment into the lower eyelid 5 times daily for 5 days 02/24/18   Lurene ShadowPhelps, Jarrah Seher O, PA-C    Family History No family history on file.  Social History Social History   Tobacco Use  . Smoking status: Never Smoker  . Smokeless tobacco: Never Used  Substance Use Topics  . Alcohol use: Yes    Alcohol/week: 0.0 oz  . Drug use: No     Allergies   Patient has no known allergies.   Review of Systems Review of Systems  Constitutional: Negative for chills and fever.  HENT: Negative for congestion and rhinorrhea.   Eyes: Positive for pain, discharge and redness. Negative for photophobia, itching and visual disturbance.   Right lower eyelid     Physical Exam Triage Vital Signs ED Triage Vitals  Enc Vitals Group     BP 02/24/18 1850 136/82     Pulse Rate 02/24/18 1850 86     Resp --      Temp 02/24/18 1850 98.2 F (36.8 C)     Temp Source 02/24/18 1850 Oral     SpO2 02/24/18 1850 98 %     Weight 02/24/18 1851 273 lb (123.8 kg)     Height 02/24/18 1851 6' (1.829 m)     Head Circumference --      Peak Flow --      Pain Score 02/24/18 1851 3     Pain Loc --      Pain Edu? --      Excl. in GC? --    No data found.  Updated Vital Signs BP 136/82 (BP Location: Right Arm)   Pulse 86   Temp 98.2 F (36.8 C) (Oral)   Ht 6' (1.829 m)   Wt 273 lb (123.8 kg)   SpO2 98%   BMI 37.03 kg/m   Visual Acuity Right Eye Distance: 20/20 Left Eye Distance: 20/20 Bilateral Distance: 20/20(without correction)  Right Eye Near:   Left Eye Near:    Bilateral Near:     Physical Exam  Constitutional: He is oriented to person, place, and time. He appears well-developed and well-nourished.  HENT:  Head:  Normocephalic and atraumatic.  Eyes: Pupils are equal, round, and reactive to light. Conjunctivae and EOM are normal. Right eye exhibits hordeolum. Right eye exhibits no discharge. Left eye exhibits no discharge. Right conjunctiva is not injected. Left conjunctiva is not injected.    Right lower eyelid: small hordeolum  Neck: Normal range of motion.  Cardiovascular: Normal rate.  Pulmonary/Chest: Effort normal.  Musculoskeletal: Normal range of motion.  Neurological: He is alert and oriented to person, place, and time.  Skin: Skin is warm and dry.  Psychiatric: He has a normal mood and affect. His behavior is normal.  Nursing note and vitals reviewed.    UC Treatments / Results  Labs (all labs ordered are listed, but only abnormal results are displayed) Labs Reviewed - No data to display  EKG None  Radiology No results found.  Procedures Procedures (including critical care  time)  Medications Ordered in UC Medications - No data to display  Initial Impression / Assessment and Plan / UC Course  I have reviewed the triage vital signs and the nursing notes.  Pertinent labs & imaging results that were available during my care of the patient were reviewed by me and considered in my medical decision making (see chart for details).     Hx and exam c/w small hordeolum Home care instructions provided.  Final Clinical Impressions(s) / UC Diagnoses   Final diagnoses:  Hordeolum of right lower eyelid, unspecified hordeolum type     Discharge Instructions      You may apply a warm damp washcloth to eyelid 3-4 times daily for 10 minutes at a time.  You should also use the antibiotic ointment as prescribed.  Please follow up with family medicine in 5-7 days if not improving, sooner if significantly worsening.     ED Prescriptions    Medication Sig Dispense Auth. Provider   erythromycin ophthalmic ointment Place a 1/2 inch ribbon of ointment into the lower eyelid 5 times daily for 5 days 3.5 g Lurene Shadow, PA-C     Controlled Substance Prescriptions  Controlled Substance Registry consulted? Not Applicable   Rolla Plate 02/25/18 1006

## 2018-02-24 NOTE — Discharge Instructions (Signed)
°  You may apply a warm damp washcloth to eyelid 3-4 times daily for 10 minutes at a time.  You should also use the antibiotic ointment as prescribed.  Please follow up with family medicine in 5-7 days if not improving, sooner if significantly worsening.

## 2018-02-24 NOTE — ED Triage Notes (Signed)
Right eye red, swollen, bottom right lid is red and inflamed, painful, eye is watering, looks like a stye

## 2018-12-20 ENCOUNTER — Telehealth: Payer: 59 | Admitting: Physician Assistant

## 2018-12-20 DIAGNOSIS — R300 Dysuria: Secondary | ICD-10-CM

## 2018-12-20 NOTE — Progress Notes (Signed)
We are sorry that you are not feeling well.  Here is how we plan to help!  Male bladder infections are not very common.  We worry about prostate or kidney conditions.  The standard of care is to examine the abdomen and kidneys, and to do a urine and blood test to make sure that something more serious is not going on.   NOTE: If you entered your credit card information for this eVisit, you will not be charged. You may see a "hold" on your card for the $35 but that hold will drop off and you will not have a charge processed.  We recommend that you see a provider today.  If your doctor's office is closed [employer] has the following Urgent Cares:  If you need care fast and have a high deductible or no insurance consider:  [email or web address] to reserve your spot online an avoid wait times  [facility] [address], [address] [place], [place] [address] Modified hours of operation: Monday-Friday, 10 AM to 6 PM  Saturday & Sunday 10 AM to 4 PM  [facility] (New Address!) [address], [address] [place], [place] [address] *Just off [address], across the road from [employer]* Modified hours of operation: Monday-Friday, 10 AM to 5 PM  Closed Saturday & Sunday  [facility]'s modified hours of operation will be in effect from Wednesday, April 1st through Thursday, April 30th.   The following sites will take your  insurance:  . [facility]  [phone] Get Driving Directions Find a Provider at this Location  [address] [place], [place] [address] . 10 am to 8 pm Monday-Friday . 12 pm to 8 pm Saturday-Sunday   . [facility]  [phone] Get Driving Directions Find a Provider at this Location  [address], [address] [place], [place] [address] . 8 am to 8 pm Monday-Friday . 9 am to 6 pm Saturday . 11 am to 6 pm Sunday   . [facility]  [place]  [phone] Get Driving Directions  [address] [place], [place] [address] . 8 am to 8 pm Monday-Friday . 8 am to 4 pm Saturday-Sunday   Your e-visit answers were reviewed by a board certified advanced clinical practitioner to complete your personal care plan.  Thank you for using e-Visits.  

## 2018-12-21 ENCOUNTER — Encounter: Payer: Self-pay | Admitting: *Deleted

## 2018-12-21 ENCOUNTER — Other Ambulatory Visit: Payer: Self-pay

## 2018-12-21 ENCOUNTER — Emergency Department
Admission: EM | Admit: 2018-12-21 | Discharge: 2018-12-21 | Disposition: A | Discharge status: Home / Self Care | Payer: 59 | Source: Home / Self Care

## 2018-12-21 DIAGNOSIS — R3989 Other symptoms and signs involving the genitourinary system: Secondary | ICD-10-CM

## 2018-12-21 LAB — POCT URINALYSIS DIP (MANUAL ENTRY)
Bilirubin, UA: NEGATIVE
Glucose, UA: NEGATIVE mg/dL
Ketones, POC UA: NEGATIVE mg/dL
Leukocytes, UA: NEGATIVE
Nitrite, UA: NEGATIVE
Protein Ur, POC: NEGATIVE mg/dL
Spec Grav, UA: 1.015 (ref 1.010–1.025)
Urobilinogen, UA: 1 E.U./dL
pH, UA: 8 (ref 5.0–8.0)

## 2018-12-21 NOTE — ED Provider Notes (Signed)
[facility]    CSN: [id number] Arrival date & time: 12/21/18  1140     History   Chief Complaint Chief Complaint  Patient presents with  . Penis Pain    HPI Noah Petersen is a 28 y.o. male.   HPI Noah Petersen is a 28 y.o. male presenting to UC with c/o constant urethral pain that started about 4 days ago after having anal sex with his girlfriend without the use of a condom.  Denies pain with urination and denies penile discharge. No known exposure to STIs but he would like to be tested.  Denies new soaps, lotions or medications. He has not tried anything for his symptoms.  Past Medical History:  Diagnosis Date  . 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 Generalized anxiety disorder 03/29/2015    Patient Active Problem List   Diagnosis Date Noted  . Abnormal PFT 05/28/2017  . Vertigo 04/26/2017  . Obesity (BMI 30-39.9) 02/19/2017  . 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 Foot pain, bilateral 05/06/2015  . Generalized anxiety disorder 03/29/2015    History reviewed. No pertinent surgical history.     Home Medications    Prior to Admission medications   Not on File    Family History History reviewed. No pertinent family history.  Social History Social History   Tobacco Use  . Smoking status: Never Smoker  . Smokeless tobacco: Never Used  Substance Use Topics  . Alcohol use: Yes    Alcohol/week: 0.0 standard drinks  . Drug use: No     Allergies   Patient has no known allergies.   Review of Systems Review of Systems  Constitutional: Negative for chills and fever.  Gastrointestinal: Negative for nausea and vomiting.  Genitourinary: Positive for penile pain ( urethra). Negative for decreased urine volume, discharge, dysuria, flank pain, frequency, genital sores, hematuria and testicular pain.  Musculoskeletal: Negative for back pain.  Skin: Negative for rash.     Physical Exam Triage Vital  Signs ED Triage Vitals  Enc Vitals Group     BP 12/21/18 1204 126/78     Pulse Rate 12/21/18 1204 71     Resp --      Temp 12/21/18 1204 98.3 F (36.8 C)     Temp Source 12/21/18 1204 Oral     SpO2 12/21/18 1204 99 %     Weight 12/21/18 1205 247 lb (112 kg)     Height --      Head Circumference --      Peak Flow --      Pain Score 12/21/18 1205 4     Pain Loc --      Pain Edu? --      Excl. in GC? --    No data found.  Updated Vital Signs BP 126/78 (BP Location: Right Arm)   Pulse 71   Temp 98.3 F (36.8 C) (Oral)   Wt 247 lb (112 kg)   SpO2 99%   BMI 33.50 kg/m   Visual Acuity Right Eye Distance:   Left Eye Distance:   Bilateral Distance:    Right Eye Near:   Left Eye Near:    Bilateral Near:     Physical Exam Vitals signs and nursing note reviewed. Exam conducted with a chaperone present.  Constitutional:      Appearance: He is well-developed.  HENT:     Head: Normocephalic and atraumatic.  Neck:     Musculoskeletal: Normal range of motion.  Cardiovascular:     Rate and Rhythm: Normal rate.  Pulmonary:     Effort: Pulmonary effort is normal.  Genitourinary:    Penis: Normal and circumcised. No erythema, discharge or lesions.      Scrotum/Testes: Normal.        Right: Tenderness or swelling not present.        Left: Tenderness or swelling not present.  Musculoskeletal: Normal range of motion.  Skin:    General: Skin is warm and dry.  Neurological:     Mental Status: He is alert and oriented to person, place, and time.  Psychiatric:        Behavior: Behavior normal.      UC Treatments / Results  Labs (all labs ordered are listed, but only abnormal results are displayed) Labs Reviewed  POCT URINALYSIS DIP (MANUAL ENTRY) - Abnormal; Notable for the following components:      Result Value   Blood, UA trace-lysed (*)    All other components within normal limits  C. TRACHOMATIS/N. GONORRHOEAE RNA  HIV ANTIBODY (ROUTINE TESTING W REFLEX)  RPR     EKG None  Radiology No results found.  Procedures Procedures (including critical care time)  Medications Ordered in UC Medications - No data to display  Initial Impression / Assessment and Plan / UC Course  I have reviewed the triage vital signs and the nursing notes.  Pertinent labs & imaging results that were available during my care of the patient were reviewed by me and considered in my medical decision making (see chart for details).     Normal exam UA unremarkable Urine and blood sent to lab for STI testing Home care info provided  Final Clinical Impressions(s) / UC Diagnoses   Final diagnoses:  Urethral pain     Discharge Instructions      Refrain from sexual activity including intercourse and oral sex until test results are back.  If you test positive and need to be treated please refrain for at least 7 days after the start of your treatment. Be sure to have all partners tested and treated for STDs.  Practice safe sex by always wearing condoms.   Please follow up with your primary care provider in 1 week if not improving, sooner if worsening/new symptoms develop.    ED Prescriptions    None     Controlled Substance Prescriptions [place] Controlled Substance Registry consulted? Not Applicable   Rolla Plate 12/21/18 1428

## 2018-12-21 NOTE — ED Triage Notes (Signed)
Patient reports he has had constant urethral pain since having anal sex with his girlfriend a few days ago. Denies any other urinary symptoms or discharge.  PASSWORD: [phone]

## 2018-12-21 NOTE — Discharge Instructions (Signed)
°  Refrain from sexual activity including intercourse and oral sex until test results are back.  If you test positive and need to be treated please refrain for at least 7 days after the start of your treatment. Be sure to have all partners tested and treated for STDs.  Practice safe sex by always wearing condoms.   Please follow up with your primary care provider in 1 week if not improving, sooner if worsening/new symptoms develop.

## 2018-12-22 ENCOUNTER — Telehealth: Payer: Self-pay | Admitting: Emergency Medicine

## 2018-12-22 LAB — C. TRACHOMATIS/N. GONORRHOEAE RNA
C. trachomatis RNA, TMA: DETECTED — AB
N. gonorrhoeae RNA, TMA: NOT DETECTED

## 2018-12-22 LAB — RPR: RPR Ser Ql: NONREACTIVE

## 2018-12-22 LAB — HIV ANTIBODY (ROUTINE TESTING W REFLEX): HIV 1&2 Ab, 4th Generation: NONREACTIVE

## 2018-12-22 MED ORDER — DOXYCYCLINE HYCLATE 100 MG PO CAPS: 100.0000 mg | ORAL_CAPSULE | Freq: Two times a day (BID) | ORAL | 0 refills | Status: DC

## 2018-12-22 NOTE — Telephone Encounter (Signed)
Chlamydia test positive. PLAN:  Begin doxycycline 100mg  BID for 7 days

## 2018-12-22 NOTE — Telephone Encounter (Signed)
Spoke with patient; after receiving his pw [phone], gave him all STI lab results which only positive for Chlamydia; he had not been given rx at time of appt; consulted with Dr.Beese and rx will be sent to patient's agreed upon pharmacy/[facility]. Instructed patient to notify his sexual contacts and that [facility] may be in touch with him. He stated he understood.

## 2018-12-30 ENCOUNTER — Encounter: Payer: Self-pay | Admitting: Family Medicine

## 2018-12-31 ENCOUNTER — Encounter: Payer: Self-pay | Admitting: Family Medicine

## 2018-12-31 ENCOUNTER — Ambulatory Visit (INDEPENDENT_AMBULATORY_CARE_PROVIDER_SITE_OTHER): Payer: 59 | Admitting: Family Medicine

## 2018-12-31 VITALS — BP 144/76 | HR 80 | Temp 97.6°F | Ht 72.0 in | Wt 249.1 lb

## 2018-12-31 DIAGNOSIS — R300 Dysuria: Secondary | ICD-10-CM | POA: Diagnosis not present

## 2018-12-31 DIAGNOSIS — Z23 Encounter for immunization: Secondary | ICD-10-CM | POA: Diagnosis not present

## 2018-12-31 MED ORDER — DOXYCYCLINE HYCLATE 100 MG PO CAPS: 100.0000 mg | ORAL_CAPSULE | Freq: Two times a day (BID) | ORAL | 0 refills | Status: DC

## 2018-12-31 NOTE — Progress Notes (Signed)
Noah Petersen is a 28 y.o. male who presents to [facility]: Primary Care Sports Medicine today for continued dysuria after chlamydia.Noah Petersen was seen in urgent care on April 19.  He had urethral pain starting on about April 15 after having anal sex with girlfriend.  In the urgent care he had evaluation including urinalysis that was largely unremarkable.  Further work-up included gonorrhea chlamydia HIV and syphilis testing.  Chlamydia came back positive and patient was treated empirically with doxycycline.  He notes he is feeling quite a bit better but still having some discomfort with urination.  He denies any abdominal pain fevers or chills nausea vomiting or diarrhea.  He is completed a 7-day course of doxycycline.  ROS as above:  Exam:  BP (!) 144/76   Pulse 80   Temp 97.6 F (36.4 C) (Oral)   Ht 6' (1.829 m)   Wt 249 lb 1.6 oz (113 kg)   BMI 33.78 kg/m  Wt Readings from Last 5 Encounters:  12/31/18 249 lb 1.6 oz (113 kg)  12/21/18 247 lb (112 kg)  02/24/18 273 lb (123.8 kg)  01/08/18 271 lb (122.9 kg)  09/09/17 266 lb (120.7 kg)    Gen: Well NAD HEENT: EOMI,  MMM Lungs: Normal work of breathing. CTABL Heart: RRR no MRG Abd: NABS, Soft. Nondistended, Nontender Exts: Brisk capillary refill, warm and well perfused.  Genitals: Normal circumcised penis with no lesions or discharge.  Nontender.  Testicles descended bilaterally and are nontender.  Lab and Radiology Results Recent Results (from the past 2160 hour(s))  C. trachomatis/N. gonorrhoeae RNA (GC/Chlamydia)     Status: Abnormal   Collection Time: 12/21/18 12:08 PM  Result Value Ref Range   C. trachomatis RNA, TMA DETECTED (A) NOT DETECT    Comment: . If results do not correlate with clinical findings, testing using an alternate molecular target which amplifies different genetic sequences can be performed on the same sample  for result confirmation within 7 days of sample receipt or per performing laboratory specimen retention policy. Alternate target testing is available; 15031 (C. trachomatis) or 36144 (N. gonorrhoeae). .    N. gonorrhoeae RNA, TMA NOT DETECTED NOT DETECT    Comment: The analytical performance characteristics of this assay, when used to test SurePath(TM) specimens have been determined by [employer]. The modifications have not been cleared or approved by the FDA. This assay has been validated pursuant to the CLIA regulations and is used for clinical purposes. . For additional information, please refer to https://education.questdiagnostics.com/faq/FAQ154 (This link is being provided for information/ educational purposes only.) .   HIV antibody     Status: None   Collection Time: 12/21/18 12:08 PM  Result Value Ref Range   HIV 1&2 Ab, 4th Generation NON-REACTIVE NON-REACTI    Comment: HIV-1 antigen and HIV-1/HIV-2 antibodies were not detected. There is no laboratory evidence of HIV infection. Marland Kitchen PLEASE NOTE: This information has been disclosed to you from records whose confidentiality may be protected by state law.  If your state requires such protection, then the state law prohibits you from making any further disclosure of the information without the specific written consent of the person to whom it pertains, or as otherwise permitted by law. A general authorization for the release of medical or other information is NOT sufficient for this purpose. . For additional information please refer to http://education.questdiagnostics.com/faq/FAQ106 (This link is being provided for informational/ educational purposes only.) . Marland Kitchen The performance of  this assay has not been clinically validated in patients less than 29 years old. .   RPR     Status: None   Collection Time: 12/21/18 12:08 PM  Result Value Ref Range   RPR Ser Ql NON-REACTIVE NON-REACTI  POCT urinalysis  dipstick (new)     Status: Abnormal   Collection Time: 12/21/18 12:08 PM  Result Value Ref Range   Color, UA yellow yellow   Clarity, UA clear clear   Glucose, UA negative negative mg/dL   Bilirubin, UA negative negative   Ketones, POC UA negative negative mg/dL   Spec Grav, UA [phone] - 1.025   Blood, UA trace-lysed (A) negative   pH, UA 8.0 5.0 - 8.0   Protein Ur, POC negative negative mg/dL   Urobilinogen, UA 1.0 0.2 or 1.0 E.U./dL   Nitrite, UA Negative Negative   Leukocytes, UA Negative Negative      Assessment and Plan: 28 y.o. male with penile discomfort following treatment for chlamydia.  Will retest urine for test of cure today.  We will go ahead and treat empirically with for another 7 days of doxycycline however based on new Chlamydia test we may cut that short.  If still positive would consider different antibiotics potentially azithromycin although that is hard to get right now with COVID-19.  Additionally will administer Tdap vaccine as patient is due.   Orders Placed This Encounter  Procedures  . C. trachomatis/N. gonorrhoeae RNA  . Tdap vaccine greater than or equal to 7yo IM   Meds ordered this encounter  Medications  . doxycycline (VIBRAMYCIN) 100 MG capsule    Sig: Take 1 capsule (100 mg total) by mouth 2 (two) times daily. Take with food.    Dispense:  14 capsule    Refill:  0     Historical information moved to improve visibility of documentation.  Past Medical History:  Diagnosis Date  . 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 Generalized anxiety disorder 03/29/2015   No past surgical history on file. Social History   Tobacco Use  . Smoking status: Never Smoker  . Smokeless tobacco: Never Used  Substance Use Topics  . Alcohol use: Yes    Alcohol/week: 0.0 standard drinks   family history is not on file.  Medications: Current Outpatient Medications  Medication Sig Dispense Refill  . doxycycline (VIBRAMYCIN) 100  MG capsule Take 1 capsule (100 mg total) by mouth 2 (two) times daily. Take with food. 14 capsule 0   No current facility-administered medications for this visit.    No Known Allergies   Discussed warning signs or symptoms. Please see discharge instructions. Patient expresses understanding.

## 2018-12-31 NOTE — Patient Instructions (Signed)
Thank you for coming in today. Ok to extend doxycycline for 1 more week.  Do not take with iron or calcium. It may also make it easier to get a sunburn.

## 2019-01-01 LAB — C. TRACHOMATIS/N. GONORRHOEAE RNA
C. trachomatis RNA, TMA: NOT DETECTED
N. gonorrhoeae RNA, TMA: NOT DETECTED

## 2019-01-11 ENCOUNTER — Encounter (INDEPENDENT_AMBULATORY_CARE_PROVIDER_SITE_OTHER): Payer: 59 | Admitting: Family Medicine

## 2019-01-11 DIAGNOSIS — N4889 Other specified disorders of penis: Secondary | ICD-10-CM

## 2019-01-13 NOTE — Telephone Encounter (Signed)
Discussed new penile lesions.  Reviewed imaging supplied by patient.  Recommend watchful waiting. Total time spent 5 minutes.

## 2019-09-21 ENCOUNTER — Ambulatory Visit: Payer: BLUE CROSS/BLUE SHIELD | Admitting: Medical-Surgical

## 2019-09-23 ENCOUNTER — Ambulatory Visit (INDEPENDENT_AMBULATORY_CARE_PROVIDER_SITE_OTHER): Payer: BLUE CROSS/BLUE SHIELD | Admitting: Sports Medicine

## 2019-09-23 ENCOUNTER — Encounter: Payer: Self-pay | Admitting: Sports Medicine

## 2019-09-23 ENCOUNTER — Other Ambulatory Visit: Payer: Self-pay

## 2019-09-23 DIAGNOSIS — Z113 Encounter for screening for infections with a predominantly sexual mode of transmission: Secondary | ICD-10-CM

## 2019-09-23 NOTE — Assessment & Plan Note (Addendum)
History of chlamydia, new relationship, would like to clean bill of health. No symptoms, adding a full STD panel.

## 2019-09-23 NOTE — Progress Notes (Signed)
    Procedures performed today:    None.  Independent interpretation of tests performed by another provider:   None.  Impression and Recommendations:    Screen for STD (sexually transmitted disease) History of chlamydia, new relationship, would like to clean bill of health. No symptoms, adding a full STD panel.    ___________________________________________ Ihor Austin. Benjamin Stain, M.D., ABFM., CAQSM. Primary Care and Sports Medicine [facility]  Adjunct Instructor of Family Medicine  [employer]

## 2019-09-24 LAB — C. TRACHOMATIS/N. GONORRHOEAE RNA
C. trachomatis RNA, TMA: DETECTED — AB
N. gonorrhoeae RNA, TMA: NOT DETECTED

## 2019-09-25 ENCOUNTER — Other Ambulatory Visit: Payer: Self-pay

## 2019-09-25 ENCOUNTER — Ambulatory Visit (INDEPENDENT_AMBULATORY_CARE_PROVIDER_SITE_OTHER): Payer: BLUE CROSS/BLUE SHIELD | Admitting: Sports Medicine

## 2019-09-25 VITALS — BP 133/77 | HR 70 | Temp 98.0°F | Wt 262.0 lb

## 2019-09-25 DIAGNOSIS — A568 Sexually transmitted chlamydial infection of other sites: Secondary | ICD-10-CM

## 2019-09-25 MED ORDER — AZITHROMYCIN 1 G PO PACK
1.0000 g | PACK | Freq: Once | ORAL | Status: AC
  Administered 2019-09-25: 1 g via ORAL

## 2019-09-25 NOTE — Progress Notes (Signed)
Pt is here for oral medication administration of azithromycin. Denies chest pain, h/a's, palpitations, dizziness or medication allergies. Pt was under supervision for 15 minutes after intake of medication. Pt tolerated oral medication without complications. Pt advised to return to the lab to repeat urine testing in 2 weeks. Pt was agreeable with plan.

## 2019-10-09 ENCOUNTER — Ambulatory Visit: Payer: BLUE CROSS/BLUE SHIELD

## 2019-10-09 ENCOUNTER — Other Ambulatory Visit: Payer: Self-pay | Admitting: Sports Medicine

## 2019-10-09 DIAGNOSIS — Z113 Encounter for screening for infections with a predominantly sexual mode of transmission: Secondary | ICD-10-CM

## 2019-10-09 NOTE — Progress Notes (Signed)
Patient is going to lab to get the blood work and the repeat urine. He is not coming for a nurse visit.

## 2019-10-10 LAB — C. TRACHOMATIS/N. GONORRHOEAE RNA
C. trachomatis RNA, TMA: NOT DETECTED
N. gonorrhoeae RNA, TMA: NOT DETECTED

## 2019-10-12 LAB — HIV ANTIBODY (ROUTINE TESTING W REFLEX): HIV 1&2 Ab, 4th Generation: NONREACTIVE

## 2019-10-12 LAB — HEPATITIS PANEL, ACUTE
Hep A IgM: NONREACTIVE
Hep B C IgM: NONREACTIVE
Hepatitis B Surface Ag: NONREACTIVE
Hepatitis C Ab: NONREACTIVE
SIGNAL TO CUT-OFF: 0.02 (ref <1.00)

## 2019-10-12 LAB — HSV 2 ANTIBODY, IGG: HSV 2 Glycoprotein G Ab, IgG: <0.9 index

## 2019-10-12 LAB — RPR: RPR Ser Ql: NONREACTIVE

## 2019-12-09 ENCOUNTER — Ambulatory Visit: Payer: BLUE CROSS/BLUE SHIELD | Admitting: Sports Medicine

## 2020-04-08 DIAGNOSIS — Z20822 Contact with and (suspected) exposure to covid-19: Secondary | ICD-10-CM | POA: Diagnosis not present
